# Patient Record
Sex: Female | Born: 1965 | Race: White | Hispanic: No | Marital: Married | Smoking: Former smoker
Health system: Southern US, Community
[De-identification: ages and names within clinical notes are randomized; demographics above are authoritative.]

## PROBLEM LIST (undated history)

## (undated) DIAGNOSIS — D509 Iron deficiency anemia, unspecified: Secondary | ICD-10-CM

## (undated) DIAGNOSIS — K5730 Diverticulosis of large intestine without perforation or abscess without bleeding: Secondary | ICD-10-CM

## (undated) DIAGNOSIS — N83209 Unspecified ovarian cyst, unspecified side: Secondary | ICD-10-CM

## (undated) DIAGNOSIS — K909 Intestinal malabsorption, unspecified: Principal | ICD-10-CM

## (undated) DIAGNOSIS — R519 Headache, unspecified: Secondary | ICD-10-CM

## (undated) DIAGNOSIS — R001 Bradycardia, unspecified: Secondary | ICD-10-CM

## (undated) DIAGNOSIS — K760 Fatty (change of) liver, not elsewhere classified: Secondary | ICD-10-CM

## (undated) DIAGNOSIS — R51 Headache: Secondary | ICD-10-CM

## (undated) DIAGNOSIS — IMO0001 Reserved for inherently not codable concepts without codable children: Secondary | ICD-10-CM

## (undated) DIAGNOSIS — K219 Gastro-esophageal reflux disease without esophagitis: Secondary | ICD-10-CM

## (undated) DIAGNOSIS — Z923 Personal history of irradiation: Secondary | ICD-10-CM

## (undated) DIAGNOSIS — R935 Abnormal findings on diagnostic imaging of other abdominal regions, including retroperitoneum: Secondary | ICD-10-CM

## (undated) DIAGNOSIS — Z8719 Personal history of other diseases of the digestive system: Secondary | ICD-10-CM

## (undated) DIAGNOSIS — J449 Chronic obstructive pulmonary disease, unspecified: Secondary | ICD-10-CM

## (undated) HISTORY — DX: Unspecified ovarian cyst, unspecified side: N83.209

## (undated) HISTORY — DX: Bradycardia, unspecified: R00.1

## (undated) HISTORY — DX: Personal history of irradiation: Z92.3

## (undated) HISTORY — DX: Abnormal findings on diagnostic imaging of other abdominal regions, including retroperitoneum: R93.5

## (undated) HISTORY — DX: Intestinal malabsorption, unspecified: K90.9

## (undated) HISTORY — PX: CHOLECYSTECTOMY: SHX55

## (undated) HISTORY — PX: ABDOMINAL HYSTERECTOMY: SHX81

## (undated) HISTORY — DX: Iron deficiency anemia, unspecified: D50.9

## (undated) HISTORY — DX: Diverticulosis of large intestine without perforation or abscess without bleeding: K57.30

## (undated) HISTORY — DX: Chronic obstructive pulmonary disease, unspecified: J44.9

---

## 2015-05-02 ENCOUNTER — Other Ambulatory Visit: Payer: Self-pay | Admitting: Sports Medicine

## 2015-05-02 DIAGNOSIS — M25551 Pain in right hip: Secondary | ICD-10-CM

## 2015-05-04 ENCOUNTER — Ambulatory Visit
Admission: RE | Admit: 2015-05-04 | Discharge: 2015-05-04 | Disposition: A | Discharge status: Home / Self Care | Payer: BLUE CROSS/BLUE SHIELD | Source: Ambulatory Visit | Attending: Sports Medicine | Admitting: Sports Medicine

## 2015-05-04 DIAGNOSIS — M25551 Pain in right hip: Secondary | ICD-10-CM

## 2015-05-14 ENCOUNTER — Other Ambulatory Visit: Payer: Self-pay | Admitting: Sports Medicine

## 2015-05-27 ENCOUNTER — Telehealth: Payer: Self-pay | Admitting: Hematology & Oncology

## 2015-05-27 NOTE — Telephone Encounter (Signed)
i called and gave patient new patient apt that was sch on 08/07/15.  Patient stated she needed to come sooner, so she cx apt and resch for 07/08/15

## 2015-06-17 ENCOUNTER — Other Ambulatory Visit: Payer: Self-pay | Admitting: Nurse Practitioner

## 2015-06-17 DIAGNOSIS — Z1231 Encounter for screening mammogram for malignant neoplasm of breast: Secondary | ICD-10-CM

## 2015-06-25 ENCOUNTER — Ambulatory Visit
Admission: RE | Admit: 2015-06-25 | Discharge: 2015-06-25 | Disposition: A | Discharge status: Home / Self Care | Payer: BLUE CROSS/BLUE SHIELD | Source: Ambulatory Visit | Attending: Nurse Practitioner | Admitting: Nurse Practitioner

## 2015-06-25 DIAGNOSIS — Z1231 Encounter for screening mammogram for malignant neoplasm of breast: Secondary | ICD-10-CM

## 2015-07-08 ENCOUNTER — Ambulatory Visit (HOSPITAL_BASED_OUTPATIENT_CLINIC_OR_DEPARTMENT_OTHER): Payer: BLUE CROSS/BLUE SHIELD

## 2015-07-08 ENCOUNTER — Encounter: Payer: Self-pay | Admitting: Hematology & Oncology

## 2015-07-08 ENCOUNTER — Ambulatory Visit (HOSPITAL_BASED_OUTPATIENT_CLINIC_OR_DEPARTMENT_OTHER): Payer: BLUE CROSS/BLUE SHIELD | Admitting: Hematology & Oncology

## 2015-07-08 ENCOUNTER — Other Ambulatory Visit (HOSPITAL_BASED_OUTPATIENT_CLINIC_OR_DEPARTMENT_OTHER): Payer: BLUE CROSS/BLUE SHIELD

## 2015-07-08 VITALS — BP 118/77 | HR 70 | Temp 97.7°F | Resp 16 | Ht 65.0 in | Wt 157.0 lb

## 2015-07-08 DIAGNOSIS — D751 Secondary polycythemia: Secondary | ICD-10-CM

## 2015-07-08 DIAGNOSIS — R935 Abnormal findings on diagnostic imaging of other abdominal regions, including retroperitoneum: Secondary | ICD-10-CM

## 2015-07-08 DIAGNOSIS — D509 Iron deficiency anemia, unspecified: Secondary | ICD-10-CM | POA: Diagnosis not present

## 2015-07-08 LAB — CBC WITH DIFFERENTIAL (CANCER CENTER ONLY)
BASO#: 0 10*3/uL (ref 0.0–0.2)
BASO%: 0.3 % (ref 0.0–2.0)
EOS ABS: 0.2 10*3/uL (ref 0.0–0.5)
EOS%: 2.6 % (ref 0.0–7.0)
HCT: 42.4 % (ref 34.8–46.6)
HGB: 14.3 g/dL (ref 11.6–15.9)
LYMPH#: 3 10*3/uL (ref 0.9–3.3)
LYMPH%: 33.7 % (ref 14.0–48.0)
MCH: 28.1 pg (ref 26.0–34.0)
MCHC: 33.7 g/dL (ref 32.0–36.0)
MCV: 83 fL (ref 81–101)
MONO#: 0.6 10*3/uL (ref 0.1–0.9)
MONO%: 7 % (ref 0.0–13.0)
NEUT#: 5.1 10*3/uL (ref 1.5–6.5)
NEUT%: 56.4 % (ref 39.6–80.0)
PLATELETS: 299 10*3/uL (ref 145–400)
RBC: 5.09 10*6/uL (ref 3.70–5.32)
RDW: 18.1 % — ABNORMAL HIGH (ref 11.1–15.7)
WBC: 9 10*3/uL (ref 3.9–10.0)

## 2015-07-08 LAB — CHCC SATELLITE - SMEAR

## 2015-07-08 LAB — LACTATE DEHYDROGENASE (CC13): LDH: 136 U/L (ref 125–245)

## 2015-07-08 NOTE — Progress Notes (Signed)
Referral MD  Reason for Referral: Iron deficiency anemia   Chief Complaint  Patient presents with  . OTHER    New Patient  : I don't feel well. Nobody Mercedes Beltran what is wrong.  HPI: Mercedes Beltran is a very charming 49 year old white female. She is actually a substance abuse counselor.. She has not felt well for about 6 months. She's been to many doctors. She's had multiple procedures.  She has been found to be anemic. She was seen at [facility]. She's had colonoscopies. She's had upper endoscopies. Everything, so far, has turned out okay.   She then had an MRI of the right hip. This is because the hip was bothering her. The radiologist somehow medic comment that there was some heterogeneity in the bone marrow which could reflect a marrow infiltrative process, anemia, advancing age.  I think it was because of this report, that she was referred to the [facility].  She is very frustrated that no one can figure out what is wrong with her.  She is not lost weight.  She has seen a urologist. She's had an issue with kidney stones. Per she saw an orthopedic surgeon for the right hip. She says they do not want to do anything for her.  She has had lab work done. The lab work back in November 2015 showed a white cell count of 7.7. Hemoglobin was 12.2. MCV is 81. Platelet count was 393.  In April of this sure, her CBC showed a white cell count of 6.5. Hemoglobin 10.8. Platelet count 458. MCV 72.  In July of this sure, a CBC showed a white count 8. Hemoglobin 15.7. MCV 87. Platelet count was 308. Thereafter she had iron studies done in July which showed an iron saturation of 27%. Back in December of last year, the iron saturation was 8%. Patient says have borderline diabetes.  She has normal electrolytes. She said that she has a fatty liver. 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  Again, she is frustrated in not feeling well and just wants to know why.  She has been attested in credibly  thoroughly for a rheumatologic issue. All of her studies have come back negative. She is tested negative for hepatitis. Her see the edge protein was normal.  Her appetite is okay. She is not a vegetarian. Patient use to smoke but does not smoke anymore. She really does not drink.  Beltran, her performance status is ECOG 1.    No past medical history on file.:  No past surgical history on file.:   Current outpatient prescriptions:  .  Cyanocobalamin (VITAMIN B-12) 5000 MCG SUBL, Place under the tongue., Disp: , Rfl:  .  esomeprazole (NEXIUM) 40 MG capsule, TK ONE C PO BID, Disp: , Rfl: 11 .  ferrous sulfate 325 (65 FE) MG tablet, Take 325 mg by mouth., Disp: , Rfl:  .  folic acid (FOLVITE) 1 MG tablet, Take 1 mg by mouth., Disp: , Rfl:  .  norethindrone (AYGESTIN) 5 MG tablet, , Disp: , Rfl: 0 .  Vitamin D, Ergocalciferol, (DRISDOL) 50000 UNITS CAPS capsule, TK 1 C PO ONCE A WEEK FOR 8 WEEKS, Disp: , Rfl: 0:  :  Not on File:  No family history on file.:  Social History   Social History  . Marital Status: Unknown    Spouse Name: N/A  . Number of Children: N/A  . Years of Education: N/A   Occupational History  . Not on file.  Social History Main Topics  . Smoking status: Former Research scientist (life sciences)  . Smokeless tobacco: Not on file     Comment: quit 01/2011  . Alcohol Use: Not on file  . Drug Use: Not on file  . Sexual Activity: Not on file   Other Topics Concern  . Not on file   Social History Narrative  . No narrative on file  :  Pertinent items are noted in HPI.  Exam: '@IPVITALS' @ Well developed and well-nourished white female. Vital signs are temperature of 97.7. Pulse 70. Blood pressure 118/77. Weight is 157 pounds. Head and neck exam shows no ocular or oral lesions. There are no palpable cervical or supraclavicular lymph nodes. There is no scleral icterus. Thyroid is nonpalpable. Lungs are clear. Cardiac exam regular rate and rhythm with no murmurs, rubs or bruits. Abdomen  is soft. She has good bowel sounds. There is no fluid wave. There is no palpable liver or spleen tip. Back exam shows no tenderness over the spine, ribs or hips. Extremities shows no clubbing, cyanosis or edema. Neurological exam shows no focal neurological deficit skin exam shows no obvious rashes.    Recent Labs  07/08/15 1328  WBC 9.0  HGB 14.3  HCT 42.4  PLT 299   No results for input(s): NA, K, CL, CO2, GLUCOSE, BUN, CREATININE, CALCIUM in the last 72 hours.  Blood smear review  Normochromic and normocytic population of red blood cells. I see no target cells. There are no teardrop cells. I see no nucleated red cells. There are no schistocytes or spherocytes. I see no real oh formation. White cells per normal morphology maturation. There is no hypersegmented polys. I see no immature myeloid or lymphoid forms. Platelets are adequate in number and size.   Pathology: None     Assessment and Plan:  Mercedes Beltran is a 49 year old white female. She does not feel well. She has iron deficiency anemia. She does take antacids. Patient is had a very thorough workup from multiple doctors.  I'm not sure we will find out what is wrong with her. I cannot imagine her having a malignancy.  However, we are obligated by the radiologist's report. As such, we will have to get a bone marrow biopsy on her. I think this is the way that we will prove that there is no hematologic issue.  I talked to her about this. She is agreeable.  I cannot imagine any tonic cancer causing her problems.  I suppose she might still be iron deficient that can because in some of her fatigue and weakness.  I spent about 45 minutes with her. I went over her labs. I spent a recommendations. She is in agreement.  We will plan to get her back once to have the bone marrow report back.

## 2015-07-09 LAB — FERRITIN CHCC: FERRITIN: 20 ng/mL (ref 9–269)

## 2015-07-09 LAB — IRON AND TIBC CHCC
%SAT: 24 % (ref 21–57)
Iron: 74 ug/dL (ref 41–142)
TIBC: 305 ug/dL (ref 236–444)
UIBC: 231 ug/dL (ref 120–384)

## 2015-07-10 ENCOUNTER — Other Ambulatory Visit: Payer: Self-pay | Admitting: Radiology

## 2015-07-10 LAB — ERYTHROPOIETIN: Erythropoietin: 10.4 m[IU]/mL (ref 2.6–18.5)

## 2015-07-11 ENCOUNTER — Ambulatory Visit (HOSPITAL_COMMUNITY)
Admission: RE | Admit: 2015-07-11 | Discharge: 2015-07-11 | Disposition: A | Discharge status: Home / Self Care | Payer: BLUE CROSS/BLUE SHIELD | Source: Ambulatory Visit | Attending: Hematology & Oncology | Admitting: Hematology & Oncology

## 2015-07-11 ENCOUNTER — Encounter (HOSPITAL_COMMUNITY): Payer: Self-pay

## 2015-07-11 DIAGNOSIS — R935 Abnormal findings on diagnostic imaging of other abdominal regions, including retroperitoneum: Secondary | ICD-10-CM | POA: Insufficient documentation

## 2015-07-11 DIAGNOSIS — Z885 Allergy status to narcotic agent status: Secondary | ICD-10-CM | POA: Insufficient documentation

## 2015-07-11 DIAGNOSIS — R937 Abnormal findings on diagnostic imaging of other parts of musculoskeletal system: Secondary | ICD-10-CM | POA: Insufficient documentation

## 2015-07-11 DIAGNOSIS — D509 Iron deficiency anemia, unspecified: Secondary | ICD-10-CM | POA: Insufficient documentation

## 2015-07-11 DIAGNOSIS — R948 Abnormal results of function studies of other organs and systems: Secondary | ICD-10-CM | POA: Diagnosis not present

## 2015-07-11 DIAGNOSIS — Z87891 Personal history of nicotine dependence: Secondary | ICD-10-CM | POA: Diagnosis not present

## 2015-07-11 DIAGNOSIS — D649 Anemia, unspecified: Secondary | ICD-10-CM | POA: Insufficient documentation

## 2015-07-11 DIAGNOSIS — Z79899 Other long term (current) drug therapy: Secondary | ICD-10-CM | POA: Diagnosis not present

## 2015-07-11 DIAGNOSIS — K219 Gastro-esophageal reflux disease without esophagitis: Secondary | ICD-10-CM | POA: Insufficient documentation

## 2015-07-11 HISTORY — DX: Fatty (change of) liver, not elsewhere classified: K76.0

## 2015-07-11 HISTORY — DX: Reserved for inherently not codable concepts without codable children: IMO0001

## 2015-07-11 HISTORY — DX: Headache: R51

## 2015-07-11 HISTORY — DX: Personal history of other diseases of the digestive system: Z87.19

## 2015-07-11 HISTORY — DX: Headache, unspecified: R51.9

## 2015-07-11 HISTORY — DX: Gastro-esophageal reflux disease without esophagitis: K21.9

## 2015-07-11 LAB — PROTIME-INR
INR: 1.04 (ref 0.00–1.49)
Prothrombin Time: 13.8 seconds (ref 11.6–15.2)

## 2015-07-11 LAB — CBC
HEMATOCRIT: 42.6 % (ref 36.0–46.0)
Hemoglobin: 14.4 g/dL (ref 12.0–15.0)
MCH: 28.7 pg (ref 26.0–34.0)
MCHC: 33.8 g/dL (ref 30.0–36.0)
MCV: 84.9 fL (ref 78.0–100.0)
PLATELETS: 297 10*3/uL (ref 150–400)
RBC: 5.02 MIL/uL (ref 3.87–5.11)
RDW: 17.6 % — AB (ref 11.5–15.5)
WBC: 7.1 10*3/uL (ref 4.0–10.5)

## 2015-07-11 LAB — BONE MARROW EXAM

## 2015-07-11 LAB — APTT: aPTT: 27 seconds (ref 24–37)

## 2015-07-11 MED ORDER — MIDAZOLAM HCL 2 MG/2ML IJ SOLN
INTRAMUSCULAR | Status: AC
  Filled 2015-07-11: qty 4

## 2015-07-11 MED ORDER — ONDANSETRON HCL 4 MG/2ML IJ SOLN
4.0000 mg | Freq: Once | INTRAMUSCULAR | Status: AC
  Administered 2015-07-11: 4 mg via INTRAVENOUS
  Filled 2015-07-11: qty 2

## 2015-07-11 MED ORDER — SODIUM CHLORIDE 0.9 % IV SOLN
INTRAVENOUS | Status: DC
  Administered 2015-07-11: 08:00:00 via INTRAVENOUS

## 2015-07-11 MED ORDER — FENTANYL CITRATE (PF) 100 MCG/2ML IJ SOLN
INTRAMUSCULAR | Status: AC | PRN
  Administered 2015-07-11: 50 ug via INTRAVENOUS

## 2015-07-11 MED ORDER — HYDROCODONE-ACETAMINOPHEN 5-325 MG PO TABS
1.0000 | ORAL_TABLET | ORAL | Status: DC | PRN
  Filled 2015-07-11: qty 2

## 2015-07-11 MED ORDER — ONDANSETRON HCL 4 MG/2ML IJ SOLN
INTRAMUSCULAR | Status: AC
  Filled 2015-07-11: qty 2

## 2015-07-11 MED ORDER — FENTANYL CITRATE (PF) 100 MCG/2ML IJ SOLN
INTRAMUSCULAR | Status: AC
  Filled 2015-07-11: qty 2

## 2015-07-11 MED ORDER — MIDAZOLAM HCL 2 MG/2ML IJ SOLN
INTRAMUSCULAR | Status: AC | PRN
Start: 2015-07-11 — End: 2015-07-11
  Administered 2015-07-11: 1 mg via INTRAVENOUS
  Administered 2015-07-11: 0.5 mg via INTRAVENOUS

## 2015-07-11 NOTE — Discharge Instructions (Signed)
Bone Marrow Aspiration and Bone Marrow Biopsy, Care After May remove dressing in 24 hours. Tylenol or like medication for pain if needed. If no relief call [facility] at [phone] Refer to this sheet in the next few weeks. These instructions provide you with information about caring for yourself after your procedure. Your health care provider may also give you more specific instructions. Your treatment has been planned according to current medical practices, but problems sometimes occur. Call your health care provider if you have any problems or questions after your procedure. WHAT TO EXPECT AFTER THE PROCEDURE After your procedure, it is common to have:  Soreness or tenderness around the puncture site.  Bruising. HOME CARE INSTRUCTIONS  Take medicines only as directed by your health care provider.  Follow your health care provider's instructions about:  Puncture site care.  Bandage (dressing) changes and removal.  Bathe and shower as directed by your health care provider.  Check your puncture site every day for signs of infection. Watch for:  Redness, swelling, or pain.  Fluid, blood, or pus.  Return to your normal activities as directed by your health care provider.  Keep all follow-up visits as directed by your health care provider. This is important. SEEK MEDICAL CARE IF:  You have a fever.  You have uncontrollable bleeding.  You have redness, swelling, or pain at the site of your puncture.  You have fluid, blood, or pus coming from your puncture site.   This information is not intended to replace advice given to you by your health care provider. Make sure you discuss any questions you have with your health care provider.   Document Released: 03/06/2005 Document Revised: 01/01/2015 Document Reviewed: 08/08/2014 Elsevier Interactive Patient Education 2016 [place]. Bone Marrow Aspiration and Bone Marrow Biopsy Bone marrow aspiration and bone marrow  biopsy are procedures that are done to diagnose blood disorders. You may also have one of these procedures to help diagnose infections or some types of cancer. Bone marrow is the soft tissue that is inside your bones. Blood cells are produced in bone marrow. For bone marrow aspiration, a sample of tissue in liquid form is removed from inside your bone. For a bone marrow biopsy, a small core of bone marrow tissue is removed. Then these samples are examined under a microscope or tested in a lab. You may need these procedures if you have an abnormal complete blood count (CBC). The aspiration or biopsy sample is usually taken from the top of your hip bone. Sometimes, an aspiration sample is taken from your chest bone (sternum). LET [facility] CARE PROVIDER KNOW ABOUT:  Any allergies you have.  All medicines you are taking, including vitamins, herbs, eye drops, creams, and over-the-counter medicines.  Previous problems you or members of your family have had with the use of anesthetics.  Any blood disorders you have.  Previous surgeries you have had.  Any medical conditions you may have.  Whether you are pregnant or you think that you may be pregnant. RISKS AND COMPLICATIONS Generally, this is a safe procedure. However, problems may occur, including:  Infection.  Bleeding. BEFORE THE PROCEDURE  Ask your health care provider about:  Changing or stopping your regular medicines. This is especially important if you are taking diabetes medicines or blood thinners.  Taking medicines such as aspirin and ibuprofen. These medicines can thin your blood. Do not take these medicines before your procedure if your health care provider instructs you not to.  Plan  to have someone take you home after the procedure.  If you go home right after the procedure, plan to have someone with you for 24 hours. PROCEDURE   An IV tube may be inserted into one of your veins.  The injection site will be cleaned  with a germ-killing solution (antiseptic).  You will be given one or more of the following:  A medicine that helps you relax (sedative).  A medicine that numbs the area (local anesthetic).  The bone marrow sample will be removed as follows:  For an aspiration, a hollow needle will be inserted through your skin and into your bone. Bone marrow fluid will be drawn up into a syringe.  For a biopsy, your health care provider will use a hollow needle to remove a core of tissue from your bone marrow.  The needle will be removed.  A bandage (dressing) will be placed over the insertion site and taped in place. The procedure may vary among health care providers and hospitals. AFTER THE PROCEDURE  Your blood pressure, heart rate, breathing rate, and blood oxygen level will be monitored often until the medicines you were given have worn off.  Return to your normal activities as directed by your health care provider.   This information is not intended to replace advice given to you by your health care provider. Make sure you discuss any questions you have with your health care provider.   Document Released: 08/20/2004 Document Revised: 01/01/2015 Document Reviewed: 08/08/2014 Elsevier Interactive Patient Education 2016 Elsevier Inc. Moderate Conscious Sedation, Adult Sedation is the use of medicines to promote relaxation and relieve discomfort and anxiety. Moderate conscious sedation is a type of sedation. Under moderate conscious sedation you are less alert than normal but are still able to respond to instructions or stimulation. Moderate conscious sedation is used during short medical and dental procedures. It is milder than deep sedation or general anesthesia and allows you to return to your regular activities sooner. LET [facility] CARE PROVIDER KNOW ABOUT:   Any allergies you have.  All medicines you are taking, including vitamins, herbs, eye drops, creams, and over-the-counter  medicines.  Use of steroids (by mouth or creams).  Previous problems you or members of your family have had with the use of anesthetics.  Any blood disorders you have.  Previous surgeries you have had.  Medical conditions you have.  Possibility of pregnancy, if this applies.  Use of cigarettes, alcohol, or illegal drugs. RISKS AND COMPLICATIONS Generally, this is a safe procedure. However, as with any procedure, problems can occur. Possible problems include:  Oversedation.  Trouble breathing on your own. You may need to have a breathing tube until you are awake and breathing on your own.  Allergic reaction to any of the medicines used for the procedure. BEFORE THE PROCEDURE  You may have blood tests done. These tests can help show how well your kidneys and liver are working. They can also show how well your blood clots.  A physical exam will be done.  Only take medicines as directed by your health care provider. You may need to stop taking medicines (such as blood thinners, aspirin, or nonsteroidal anti-inflammatory drugs) before the procedure.   Do not eat or drink at least 6 hours before the procedure or as directed by your health care provider.  Arrange for a responsible adult, family member, or friend to take you home after the procedure. He or she should stay with you for at least 24  hours after the procedure, until the medicine has worn off. PROCEDURE   An intravenous (IV) catheter will be inserted into one of your veins. Medicine will be able to flow directly into your body through this catheter. You may be given medicine through this tube to help prevent pain and help you relax.  The medical or dental procedure will be done. AFTER THE PROCEDURE  You will stay in a recovery area until the medicine has worn off. Your blood pressure and pulse will be checked.   Depending on the procedure you had, you may be allowed to go home when you can tolerate liquids and your  pain is under control.   This information is not intended to replace advice given to you by your health care provider. Make sure you discuss any questions you have with your health care provider.   Document Released: 05/12/2001 Document Revised: 09/07/2014 Document Reviewed: 04/24/2013 Elsevier Interactive Patient Education [employer].

## 2015-07-11 NOTE — H&P (Signed)
Chief Complaint: Patient was seen in consultation today for bone marrow biopsy at the request of Ennever,Peter R  Referring Physician(s): Ennever,Peter R  History of Present Illness: Mercedes Beltran is a 49 y.o. female with chronic anemia. She also recently had an MR of her hip which suggested some heterogeneity of her marrow. She is referred for bone marrow biopsy. PMHx, meds, labs, imaging reviewed. Has been NPO this am  Past Medical History  Diagnosis Date  . Shortness of breath dyspnea     since 08/2014  . GERD (gastroesophageal reflux disease)   . History of hiatal hernia   . Headache   . Fatty liver     Past Surgical History  Procedure Laterality Date  . Cholecystectomy    . Abdominal hysterectomy      Allergies: Codeine; Demerol; and Morphine and related  Medications: Prior to Admission medications   Medication Sig Start Date End Date Taking? Authorizing Provider  esomeprazole (NEXIUM) 40 MG capsule TK ONE C PO BID 06/19/15  Yes Historical Provider, MD  ferrous sulfate 325 (65 FE) MG tablet Take 325 mg by mouth. 12/12/14 12/12/15 Yes Historical Provider, MD  folic acid (FOLVITE) 1 MG tablet Take 1 mg by mouth.   Yes Historical Provider, MD  ibuprofen (ADVIL,MOTRIN) 200 MG tablet Take 200 mg by mouth every 6 (six) hours as needed.   Yes Historical Provider, MD  norethindrone (AYGESTIN) 5 MG tablet  06/12/15  Yes Historical Provider, MD  Cyanocobalamin (VITAMIN B-12) 5000 MCG SUBL Place under the tongue.    Historical Provider, MD  Vitamin D, Ergocalciferol, (DRISDOL) 50000 UNITS CAPS capsule TK 1 C PO ONCE A WEEK FOR 8 WEEKS 06/14/15   Historical Provider, MD     History reviewed. No pertinent family history.  Social History   Social History  . Marital Status: Married    Spouse Name: N/A  . Number of Children: N/A  . Years of Education: N/A   Social History Main Topics  . Smoking status: Former Smoker    Quit date: 02/20/2011  . Smokeless tobacco: None    Comment: quit 01/2011  . Alcohol Use: No  . Drug Use: None  . Sexual Activity: Yes   Other Topics Concern  . None   Social History Narrative    Review of Systems: A 12 point ROS discussed and pertinent positives are indicated in the HPI above.  All other systems are negative.  Review of Systems  Vital Signs: BP 114/63 mmHg  Pulse 57  Temp(Src) 98.5 F (36.9 C) (Oral)  Resp 16  Ht '5\' 5"'  (1.651 m)  Wt 157 lb (71.215 kg)  BMI 26.13 kg/m2  SpO2 99%  Physical Exam  Constitutional: She is oriented to person, place, and time. She appears well-developed and well-nourished. No distress.  HENT:  Head: Normocephalic.  Mouth/Throat: Oropharynx is clear and moist.  Neck: Normal range of motion. No JVD present. No tracheal deviation present.  Cardiovascular: Normal rate, regular rhythm and normal heart sounds.   Pulmonary/Chest: Effort normal and breath sounds normal. No respiratory distress.  Abdominal: Soft. She exhibits no mass. There is no tenderness.  Neurological: She is alert and oriented to person, place, and time.  Psychiatric: She has a normal mood and affect. Judgment normal.    Mallampati Score:  MD Evaluation Airway: WNL Heart: WNL Abdomen: WNL Chest/ Lungs: WNL ASA  Classification: 2 Mallampati/Airway Score: One  Imaging: Mm Digital Screening Bilateral  07/09/2015  CLINICAL DATA:  Screening. EXAM: DIGITAL  SCREENING BILATERAL MAMMOGRAM WITH CAD COMPARISON:  Previous exam(s). ACR Breast Density Category c: The breast tissue is heterogeneously dense, which may obscure small masses. FINDINGS: There are no findings suspicious for malignancy. Images were processed with CAD. IMPRESSION: No mammographic evidence of malignancy. A result letter of this screening mammogram will be mailed directly to the patient. RECOMMENDATION: Screening mammogram in one year. (Code:SM-B-01Y) BI-RADS CATEGORY  1: Negative. Electronically Signed   By: Ammie Ferrier M.D.   On: 07/09/2015  14:58    Labs:  CBC:  Recent Labs  07/08/15 1328 07/11/15 0730  WBC 9.0 7.1  HGB 14.3 14.4  HCT 42.4 42.6  PLT 299 297    COAGS:  Recent Labs  07/11/15 0730  INR 1.04  APTT 27    Assessment and Plan: Anemia Abnormal appearance of bone marrow on recent MRI Plan for bone marrow biopsy today Labs reviewed. Risks and Benefits discussed with the patient including, but not limited to bleeding, infection, damage to adjacent structures or low yield requiring additional tests. All of the patient's questions were answered, patient is agreeable to proceed. Consent signed and in chart.    Thank you for this interesting consult.  I greatly enjoyed meeting Mercedes Beltran and look forward to participating in their care.  A copy of this report was sent to the requesting provider on this date.  SignedAscencion Dike 07/11/2015, 8:40 AM   I spent a total of 20 minutesin face to face in clinical consultation, greater than 50% of which was counseling/coordinating care for bone marrow biopsy

## 2015-07-11 NOTE — Procedures (Signed)
CT-guided  R iliac bone marrow aspiration and core biopsy No complication No blood loss. See complete dictation in [facility] PACS  

## 2015-07-17 ENCOUNTER — Other Ambulatory Visit: Payer: Self-pay | Admitting: Hematology & Oncology

## 2015-07-17 ENCOUNTER — Encounter: Payer: Self-pay | Admitting: Hematology & Oncology

## 2015-07-17 DIAGNOSIS — K909 Intestinal malabsorption, unspecified: Secondary | ICD-10-CM | POA: Insufficient documentation

## 2015-07-17 DIAGNOSIS — R5382 Chronic fatigue, unspecified: Secondary | ICD-10-CM

## 2015-07-17 HISTORY — DX: Intestinal malabsorption, unspecified: K90.9

## 2015-07-17 LAB — CHROMOSOME ANALYSIS, BONE MARROW

## 2015-07-18 ENCOUNTER — Ambulatory Visit (HOSPITAL_BASED_OUTPATIENT_CLINIC_OR_DEPARTMENT_OTHER): Payer: BLUE CROSS/BLUE SHIELD

## 2015-07-18 VITALS — BP 109/69 | HR 73 | Temp 98.4°F | Resp 20

## 2015-07-18 DIAGNOSIS — D509 Iron deficiency anemia, unspecified: Secondary | ICD-10-CM

## 2015-07-18 DIAGNOSIS — K909 Intestinal malabsorption, unspecified: Secondary | ICD-10-CM

## 2015-07-18 MED ORDER — SODIUM CHLORIDE 0.9 % IV SOLN
Freq: Once | INTRAVENOUS | Status: AC
  Administered 2015-07-18: 14:00:00 via INTRAVENOUS

## 2015-07-18 MED ORDER — FERUMOXYTOL INJECTION 510 MG/17 ML
510.0000 mg | Freq: Once | INTRAVENOUS | Status: AC
  Administered 2015-07-18: 510 mg via INTRAVENOUS
  Filled 2015-07-18: qty 17

## 2015-07-18 NOTE — Patient Instructions (Addendum)
Ferumoxytol injection What is this medicine? FERUMOXYTOL is an iron complex. Iron is used to make healthy red blood cells, which carry oxygen and nutrients throughout the body. This medicine is used to treat iron deficiency anemia in people with chronic kidney disease. This medicine may be used for other purposes; ask your health care provider or pharmacist if you have questions. What should I tell my health care provider before I take this medicine? They need to know if you have any of these conditions: -anemia not caused by low iron levels -high levels of iron in the blood -magnetic resonance imaging (MRI) test scheduled -an unusual or allergic reaction to iron, other medicines, foods, dyes, or preservatives -pregnant or trying to get pregnant -breast-feeding How should I use this medicine? This medicine is for injection into a vein. It is given by a health care professional in a hospital or clinic setting. Talk to your pediatrician regarding the use of this medicine in children. Special care may be needed. Overdosage: If you think you have taken too much of this medicine contact a poison control center or emergency room at once. NOTE: This medicine is only for you. Do not share this medicine with others. What if I miss a dose? It is important not to miss your dose. Call your doctor or health care professional if you are unable to keep an appointment. What may interact with this medicine? This medicine may interact with the following medications: -other iron products This list may not describe all possible interactions. Give your health care provider a list of all the medicines, herbs, non-prescription drugs, or dietary supplements you use. Also tell them if you smoke, drink alcohol, or use illegal drugs. Some items may interact with your medicine. What should I watch for while using this medicine? Visit your doctor or healthcare professional regularly. Tell your doctor or healthcare  professional if your symptoms do not start to get better or if they get worse. You may need blood work done while you are taking this medicine. You may need to follow a special diet. Talk to your doctor. Foods that contain iron include: whole grains/cereals, dried fruits, beans, or peas, leafy green vegetables, and organ meats (liver, kidney). What side effects may I notice from receiving this medicine? Side effects that you should report to your doctor or health care professional as soon as possible: -allergic reactions like skin rash, itching or hives, swelling of the face, lips, or tongue -breathing problems -changes in blood pressure -feeling faint or lightheaded, falls -fever or chills -flushing, sweating, or hot feelings -swelling of the ankles or feet Side effects that usually do not require medical attention (Report these to your doctor or health care professional if they continue or are bothersome.): -diarrhea -headache -nausea, vomiting -stomach pain This list may not describe all possible side effects. Call your doctor for medical advice about side effects. You may report side effects to FDA at 1-800-FDA-1088. Where should I keep my medicine? This drug is given in a hospital or clinic and will not be stored at home. NOTE: This sheet is a summary. It may not cover all possible information. If you have questions about this medicine, talk to your doctor, pharmacist, or health care provider.    2016, Elsevier/Gold Standard. (2012-04-01 15:23:36) Iron Level and Total Iron-Binding Capacity Tests Iron comes from your diet and binds to a part of the red blood cell called hemoglobin. Hemoglobin carries iron throughout your body. In order for iron to bind to  hemoglobin, it must first be carried from your small intestine into your bone marrow by proteins called transferrin. Iron level and total iron-binding capacity (TIBC) are tests that determine how much iron you have in your blood (serum  iron level), and the amount of transferrin that is available to transport iron to the bone marrow (iron-binding capacity). These tests are often done together. They may be done to:  Diagnose iron-deficiency anemia.  Monitor treatment for iron-deficiency anemia.  Monitor the progression of conditions that cause your body to break down red blood cells more quickly than normal. You may have these tests if:   You are pregnant.  You have had a blood test that showed abnormal red blood cell numbers, size, or color.  Your health care provider suspects iron overload, iron poisoning, or low levels of iron due to blood loss. PREPARATION FOR TEST Do not eat or drink anything after midnight on the night before the procedure or as directed by your health care provider. RESULTS It is your responsibility to obtain your test results. Ask the lab or department performing the test when and how you will get your results. Talk with your health care provider if you have any questions about your results. Range of Normal Values Ranges for normal values may vary among different labs and hospitals. You should always check with your health care provider after having lab work or other tests done to discuss whether your values are considered within normal limits. The normal findings for each test are listed here: Iron  Female: 80-180 microgram/dL or 14-32 micromole/L (SI units).  Female: 60-160 microgram/dL or 11-29 micromole/L (SI units).  Newborn: 100-250 microgram/dL.  Child: 50-120 microgram/dL. TIBC  250-460 microgram/dL or 45-82 micromole/L (SI units). Transferrin  Adult female: 215-365 mg/dL or 2.15-3.65 g/L (SI units).  Adult female: 250-380 mg/dL or 2.5-3.8 g/L (SI units).  Newborn: 130-275 mg/dL.  Child: 203-360 mg/dL. Transferrin saturation  Female: 20-50%.  Female: 15-50%. Many factors can affect iron level and TIBC test results. These may include:  Recent blood transfusions.  Recently  eating a meal that contains high levels of iron.  Certain medicines. Meaning of Results Outside Normal Value Ranges Serum iron levels that are higher than normal may be related to many health conditions, including:  Genetic disorders that cause an increase in iron levels, such as hemosiderosis and hemochromatosis.  Iron poisoning.  Hemolytic anemia.  Liver diseases such as hepatitis and hepatic necrosis.  Lead poisoning. Serum iron levels that are lower than normal may be related to many health conditions, including:  Poor dietary iron intake.  Long-term blood loss.  Insufficient absorption of iron in the small intestine.  Pregnancy.  Iron-deficiency anemia.  Tumors. Abnormally high TIBC may be related to many health conditions, including:  Estrogen therapy.  Pregnancy.  Polycythemia vera.  Iron-deficiency anemia. Abnormally low TIBC may be related to many health conditions, including:  Malnutrition.  Hypoproteinemia.  Conditions that cause swelling (inflammation) throughout the body.  Cirrhosis of the liver.  Some anemias, including hemolytic, pernicious, and sickle cell anemias. Discuss your test results with your health care provider. He or she will use the results to make a diagnosis and determine a treatment plan that is right for you.   This information is not intended to replace advice given to you by your health care provider. Make sure you discuss any questions you have with your health care provider.   Document Released: 09/10/2004 Document Revised: 09/07/2014 Document Reviewed: 01/03/2014 Elsevier Interactive Patient  Education 2016 [employer].

## 2015-08-07 ENCOUNTER — Other Ambulatory Visit: Payer: BLUE CROSS/BLUE SHIELD

## 2015-08-07 ENCOUNTER — Ambulatory Visit: Payer: BLUE CROSS/BLUE SHIELD | Admitting: Hematology & Oncology

## 2015-08-07 ENCOUNTER — Ambulatory Visit: Payer: BLUE CROSS/BLUE SHIELD

## 2015-08-23 ENCOUNTER — Encounter: Payer: Self-pay | Admitting: Family

## 2015-08-23 ENCOUNTER — Ambulatory Visit (HOSPITAL_BASED_OUTPATIENT_CLINIC_OR_DEPARTMENT_OTHER): Payer: BLUE CROSS/BLUE SHIELD | Admitting: Family

## 2015-08-23 ENCOUNTER — Other Ambulatory Visit (HOSPITAL_BASED_OUTPATIENT_CLINIC_OR_DEPARTMENT_OTHER): Payer: BLUE CROSS/BLUE SHIELD

## 2015-08-23 VITALS — BP 106/74 | HR 70 | Temp 97.4°F | Resp 16 | Ht 65.0 in | Wt 156.0 lb

## 2015-08-23 DIAGNOSIS — D509 Iron deficiency anemia, unspecified: Secondary | ICD-10-CM

## 2015-08-23 DIAGNOSIS — R5382 Chronic fatigue, unspecified: Secondary | ICD-10-CM

## 2015-08-23 DIAGNOSIS — R5383 Other fatigue: Secondary | ICD-10-CM

## 2015-08-23 DIAGNOSIS — K909 Intestinal malabsorption, unspecified: Secondary | ICD-10-CM

## 2015-08-23 LAB — CMP (CANCER CENTER ONLY)
ALT(SGPT): 30 U/L (ref 10–47)
AST: 25 U/L (ref 11–38)
Albumin: 3.7 g/dL (ref 3.3–5.5)
Alkaline Phosphatase: 61 U/L (ref 26–84)
BUN, Bld: 11 mg/dL (ref 7–22)
CALCIUM: 9.4 mg/dL (ref 8.0–10.3)
CO2: 25 meq/L (ref 18–33)
CREATININE: 0.8 mg/dL (ref 0.6–1.2)
Chloride: 105 mEq/L (ref 98–108)
GLUCOSE: 86 mg/dL (ref 73–118)
Potassium: 4.3 mEq/L (ref 3.3–4.7)
SODIUM: 143 meq/L (ref 128–145)
Total Bilirubin: 1 mg/dl (ref 0.20–1.60)
Total Protein: 7.6 g/dL (ref 6.4–8.1)

## 2015-08-23 LAB — CBC WITH DIFFERENTIAL (CANCER CENTER ONLY)
BASO#: 0 10*3/uL (ref 0.0–0.2)
BASO%: 0.5 % (ref 0.0–2.0)
EOS%: 1.2 % (ref 0.0–7.0)
Eosinophils Absolute: 0.1 10*3/uL (ref 0.0–0.5)
HCT: 47.1 % — ABNORMAL HIGH (ref 34.8–46.6)
HGB: 16 g/dL — ABNORMAL HIGH (ref 11.6–15.9)
LYMPH#: 2.6 10*3/uL (ref 0.9–3.3)
LYMPH%: 35.4 % (ref 14.0–48.0)
MCH: 29.4 pg (ref 26.0–34.0)
MCHC: 34 g/dL (ref 32.0–36.0)
MCV: 86 fL (ref 81–101)
MONO#: 0.5 10*3/uL (ref 0.1–0.9)
MONO%: 7.1 % (ref 0.0–13.0)
NEUT#: 4.1 10*3/uL (ref 1.5–6.5)
NEUT%: 55.8 % (ref 39.6–80.0)
PLATELETS: 306 10*3/uL (ref 145–400)
RBC: 5.45 10*6/uL — ABNORMAL HIGH (ref 3.70–5.32)
RDW: 15.5 % (ref 11.1–15.7)
WBC: 7.4 10*3/uL (ref 3.9–10.0)

## 2015-08-23 LAB — FERRITIN: Ferritin: 143 ng/ml (ref 9–269)

## 2015-08-23 LAB — TSH: TSH: 1.673 m(IU)/L (ref 0.308–3.960)

## 2015-08-23 LAB — IRON AND TIBC
%SAT: 59 % — AB (ref 21–57)
IRON: 171 ug/dL — AB (ref 41–142)
TIBC: 290 ug/dL (ref 236–444)
UIBC: 119 ug/dL — ABNORMAL LOW (ref 120–384)

## 2015-08-23 LAB — CHCC SATELLITE - SMEAR

## 2015-08-23 NOTE — Progress Notes (Signed)
Hematology and Oncology Follow Up Visit  Mercedes Beltran [id number] [birth year] 49 y.o. 08/23/2015   Principle Diagnosis:  Iron deficiency anemia   Current Therapy:   IV iron as indicated    Interim History:  Mercedes Beltran is here today for a follow-up. She is still having fatigue, chills and night sweats. She received Feraheme in November. We did recheck her iron studies today and results are pending.  She has some SOB at times with exertion. No fever, n/v, cough, rash, chest pain or changes in bowel or bladder habits.  No episodes of bleeding or bruising. No lymphadenopathy found on exam.  She has had occasional palpitations, dizziness and abdominal pain that comes and goes.  Her right hip still bother her at times. Her bone marrow biopsy was negative. No swelling, tenderness, numbness or tingling in her extremities at this time.  She is eating healthy and consuming iron rich foods daily. She is staying well hydrated. Her weight is unchanged.    Medications:    Medication List       This list is accurate as of: 08/23/15  8:49 AM.  Always use your most recent med list.               esomeprazole 40 MG capsule  Commonly known as:  NEXIUM  TK ONE C PO BID     folic acid 1 MG tablet  Commonly known as:  FOLVITE  Take 1 mg by mouth.     ibuprofen 200 MG tablet  Commonly known as:  ADVIL,MOTRIN  Take 200 mg by mouth every 6 (six) hours as needed.     norethindrone 5 MG tablet  Commonly known as:  AYGESTIN     Vitamin B-12 5000 MCG Subl  Place under the tongue.     Vitamin D (Ergocalciferol) 50000 UNITS Caps capsule  Commonly known as:  DRISDOL  TK 1 C PO ONCE A WEEK FOR 8 WEEKS        Allergies:  Allergies  Allergen Reactions  . Codeine Nausea And Vomiting  . Demerol [Meperidine] Nausea And Vomiting  . Morphine And Related Nausea And Vomiting    Past Medical History, Surgical history, Social history, and Family History were reviewed and updated.  Review of  Systems: All other 10 point review of systems is negative.   Physical Exam:  height is 5' 5" (1.651 m) and weight is 156 lb (70.761 kg). Her oral temperature is 97.4 F (36.3 C). Her blood pressure is 106/74 and her pulse is 70. Her respiration is 16.   Wt Readings from Last 3 Encounters:  08/23/15 156 lb (70.761 kg)  07/11/15 157 lb (71.215 kg)  07/08/15 157 lb (71.215 kg)    Ocular: Sclerae unicteric, pupils equal, round and reactive to light Ear-nose-throat: Oropharynx clear, dentition fair Lymphatic: No cervical supraclavicular or axillary adenopathy Lungs no rales or rhonchi, good excursion bilaterally Heart regular rate and rhythm, no murmur appreciated Abd soft, nontender, positive bowel sounds, no liver or spleen tip palpated on exam MSK no focal spinal tenderness, no joint edema Neuro: non-focal, well-oriented, appropriate affect Breasts: Deferred  Lab Results  Component Value Date   WBC 7.4 08/23/2015   HGB 16.0* 08/23/2015   HCT 47.1* 08/23/2015   MCV 86 08/23/2015   PLT 306 08/23/2015   Lab Results  Component Value Date   FERRITIN 20 07/08/2015   IRON 74 07/08/2015   TIBC 305 07/08/2015   UIBC 231 07/08/2015   IRONPCTSAT 24 07/08/2015  Lab Results  Component Value Date   RBC 5.45* 08/23/2015   No results found for: KPAFRELGTCHN, LAMBDASER, KAPLAMBRATIO No results found for: IGGSERUM, IGA, IGMSERUM No results found for: TOTALPROTELP, ALBUMINELP, A1GS, A2GS, BETS, BETA2SER, GAMS, MSPIKE, SPEI   Chemistry   No results found for: NA, K, CL, CO2, BUN, CREATININE, GLU No results found for: CALCIUM, ALKPHOS, AST, ALT, BILITOT   Impression and Plan: Mercedes Beltran is a 49 yo white female with iron deficiency anemia and NASH. She received feraheme in November so we will see what her iron studies today show. Her CBC and CMP today look good.  She is still c/o fatigue, chills, dizziness, SOB with exertion, night sweats and occasional palpitations. All prior work-ups have  been negative.  Her bone marrow biopsy in November was also negative. No abnormality or evidence of malignancy.  We will give her a dose of Feraheme next week if needed.  At this point we will follow-up with her as needed.  She plans to follow-up with her PCP to have her thyroid checked. It may also be beneficial for her to follow-up with her gynecologist and have her labs checked to see if she is starting to go through menopause.  She will contact us with any heme/onc related issues. We will be happy to see her again when needed.   Eliezer Bottom, NP 12/23/20168:49 AM

## 2015-09-06 ENCOUNTER — Other Ambulatory Visit (HOSPITAL_COMMUNITY)
Admission: RE | Admit: 2015-09-06 | Discharge: 2015-09-06 | Disposition: A | Discharge status: Home / Self Care | Payer: BLUE CROSS/BLUE SHIELD | Source: Ambulatory Visit | Attending: Obstetrics & Gynecology | Admitting: Obstetrics & Gynecology

## 2015-09-06 ENCOUNTER — Other Ambulatory Visit: Payer: Self-pay | Admitting: Obstetrics & Gynecology

## 2015-09-06 DIAGNOSIS — Z01419 Encounter for gynecological examination (general) (routine) without abnormal findings: Secondary | ICD-10-CM | POA: Diagnosis present

## 2015-09-06 DIAGNOSIS — Z1151 Encounter for screening for human papillomavirus (HPV): Secondary | ICD-10-CM | POA: Insufficient documentation

## 2015-09-10 LAB — CYTOLOGY - PAP

## 2015-12-20 ENCOUNTER — Encounter: Payer: Self-pay | Admitting: Hematology & Oncology

## 2017-04-02 ENCOUNTER — Other Ambulatory Visit (HOSPITAL_COMMUNITY): Payer: Self-pay | Admitting: Family

## 2017-04-02 DIAGNOSIS — R945 Abnormal results of liver function studies: Secondary | ICD-10-CM

## 2017-04-02 DIAGNOSIS — R7989 Other specified abnormal findings of blood chemistry: Secondary | ICD-10-CM

## 2017-04-09 ENCOUNTER — Encounter (INDEPENDENT_AMBULATORY_CARE_PROVIDER_SITE_OTHER): Payer: Self-pay | Admitting: Orthopedic Surgery

## 2017-04-09 ENCOUNTER — Encounter (HOSPITAL_COMMUNITY): Payer: Self-pay

## 2017-04-09 ENCOUNTER — Ambulatory Visit (HOSPITAL_COMMUNITY)
Admission: RE | Admit: 2017-04-09 | Discharge: 2017-04-09 | Disposition: A | Discharge status: Home / Self Care | Payer: BC Managed Care – PPO | Source: Ambulatory Visit | Attending: Family | Admitting: Family

## 2017-04-09 ENCOUNTER — Ambulatory Visit (INDEPENDENT_AMBULATORY_CARE_PROVIDER_SITE_OTHER): Payer: BC Managed Care – PPO | Admitting: Orthopedic Surgery

## 2017-04-09 VITALS — Ht 65.0 in | Wt 156.0 lb

## 2017-04-09 DIAGNOSIS — Z9049 Acquired absence of other specified parts of digestive tract: Secondary | ICD-10-CM | POA: Insufficient documentation

## 2017-04-09 DIAGNOSIS — R945 Abnormal results of liver function studies: Secondary | ICD-10-CM | POA: Insufficient documentation

## 2017-04-09 DIAGNOSIS — M545 Low back pain, unspecified: Secondary | ICD-10-CM | POA: Insufficient documentation

## 2017-04-09 DIAGNOSIS — M79604 Pain in right leg: Secondary | ICD-10-CM

## 2017-04-09 DIAGNOSIS — R7989 Other specified abnormal findings of blood chemistry: Secondary | ICD-10-CM

## 2017-04-09 MED ORDER — IOPAMIDOL (ISOVUE-300) INJECTION 61%
100.0000 mL | Freq: Once | INTRAVENOUS | Status: AC | PRN
  Administered 2017-04-09: 100 mL via INTRAVENOUS

## 2017-04-09 MED ORDER — IOPAMIDOL (ISOVUE-300) INJECTION 61%
INTRAVENOUS | Status: AC
  Filled 2017-04-09: qty 100

## 2017-04-09 MED ORDER — CELECOXIB 200 MG PO CAPS: 200.0000 mg | ORAL_CAPSULE | Freq: Two times a day (BID) | ORAL | 3 refills | Status: DC

## 2017-04-09 NOTE — Progress Notes (Signed)
Office Visit Note   Patient: Mercedes Beltran           Date of Birth: Feb 11, 1966           MRN: [id number] Visit Date: 04/09/2017              Requested by: No referring provider defined for this encounter. PCP: Imagene Riches, NP  Chief Complaint  Patient presents with  . Right Hip - Pain    Pt c/o of lateral hip pain, no groin pain. Does have LBP with rad s/s to posterior thigh       HPI: Patient is a 51 year old woman who is been having iliac crest hip pain as well as a feeling of radicular heat sensation in the posterior right thigh. Patient also complains of lower back pain midline. Patient denies any numbness tingling or weakness. Patient has had an MRI scan of her right hip approximately 2 years ago.  Assessment & Plan: Visit Diagnoses:  1. Low back pain radiating to right leg     Plan: We will start her on Celebrex 200 mg by mouth twice a day to see if this helps with radicular symptoms. Discussed that if this does not help with the symptoms we would obtain two-view radiographs of the lumbar spine and anticipate setting her up for an MRI scan of the lumbar spine as well. Discussed that if she has any change in her symptoms or any questions to call us as soon as possible.  Follow-Up Instructions: Return in about 2 weeks (around 04/23/2017).   Ortho Exam  Patient is alert, oriented, no adenopathy, well-dressed, normal affect, normal respiratory effort. Examination patient has a normal gait. She states that her heat sensation in the posterior thigh as well as the iliac crest pain that it is present despite position sitting standing or walking. Patient has a negative straight leg raise she has no pain with range of motion of the hip near ankle there is no focal motor weakness. Examination she has tenderness to palpation over the anterior superior iliac spine. The lateral femoral cutaneous nerve distribution is unaffected and is nontender to palpation. Review of her MRI scan from 2  years ago showed no internal derangement of the hip she did have intra-abdominal changes with an ovarian cyst and diverticulosis but did not have any soft tissue mass or bony mass.  Imaging: Ct Abdomen W Wo Contrast  Result Date: 04/09/2017 CLINICAL DATA:  Abnormal LFTs, 25 pound weight loss EXAM: CT ABDOMEN WITHOUT AND WITH CONTRAST TECHNIQUE: Multidetector CT imaging of the abdomen was performed following the standard protocol before and following the bolus administration of intravenous contrast. CONTRAST:  141mL ISOVUE-300 IOPAMIDOL (ISOVUE-300) INJECTION 61% COMPARISON:  None. FINDINGS: Lower chest:  Lung bases are clear. Hepatobiliary: No enhancing hepatic lesion. No biliary duct dilatation. Postcholecystectomy. Portal veins and hepatic veins are patent. Small cyst centrally within the liver. Common bile duct normal caliber Pancreas: Normal pancreatic parenchymal intensity. No ductal dilatation or inflammation. Spleen: Normal spleen. Adrenals/urinary tract: Adrenal glands and kidneys are normal. Stomach/Bowel: Stomach and limited of the small bowel is unremarkable Vascular/Lymphatic: Abdominal aortic normal caliber. No retroperitoneal periportal lymphadenopathy. Musculoskeletal: No aggressive osseous lesion IMPRESSION: 1. Normal liver post cholecystectomy by CT imaging. 2. Normal pancreas and biliary tree. Electronically Signed   By: Suzy Bouchard M.D.   On: 04/09/2017 09:43   No images are attached to the encounter.  Labs: No results found for: HGBA1C, ESRSEDRATE, CRP, LABURIC, REPTSTATUS, GRAMSTAIN, CULT, LABORGA  Orders:  No orders of the defined types were placed in this encounter.  Meds ordered this encounter  Medications  . celecoxib (CELEBREX) 200 MG capsule    Sig: Take 1 capsule (200 mg total) by mouth 2 (two) times daily.    Dispense:  60 capsule    Refill:  3     Procedures: No procedures performed  Clinical Data: No additional findings.  ROS:  All other systems  negative, except as noted in the HPI. Review of Systems  Objective: Vital Signs: Ht 5\' 5"  (1.651 m)   Wt 156 lb (70.8 kg)   BMI 25.96 kg/m   Specialty Comments:  No specialty comments available.  PMFS History: Patient Active Problem List   Diagnosis Date Noted  . Low back pain radiating to right leg 04/09/2017  . 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 Abnormal MRI, pelvis   . Iron deficiency anemia    Past Medical History:  Diagnosis Date  . Fatty liver   . GERD (gastroesophageal reflux disease)   . Headache   . History of hiatal hernia   . 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 Shortness of breath dyspnea    since 08/2014    No family history on file.  Past Surgical History:  Procedure Laterality Date  . ABDOMINAL HYSTERECTOMY    . CHOLECYSTECTOMY     Social History   Occupational History  . Not on file.   Social History Main Topics  . Smoking status: Former Smoker    Quit date: 02/20/2011  . Smokeless tobacco: Never Used     Comment: quit 01/2011  . Alcohol use No  . Drug use: Unknown  . Sexual activity: Yes

## 2017-04-23 ENCOUNTER — Encounter (INDEPENDENT_AMBULATORY_CARE_PROVIDER_SITE_OTHER): Payer: Self-pay | Admitting: Family

## 2017-04-23 ENCOUNTER — Ambulatory Visit (INDEPENDENT_AMBULATORY_CARE_PROVIDER_SITE_OTHER): Payer: BC Managed Care – PPO | Admitting: Family

## 2017-04-23 ENCOUNTER — Ambulatory Visit (INDEPENDENT_AMBULATORY_CARE_PROVIDER_SITE_OTHER): Payer: Self-pay

## 2017-04-23 DIAGNOSIS — M79604 Pain in right leg: Secondary | ICD-10-CM

## 2017-04-23 DIAGNOSIS — M25551 Pain in right hip: Secondary | ICD-10-CM

## 2017-04-23 DIAGNOSIS — M545 Low back pain: Secondary | ICD-10-CM

## 2017-04-23 NOTE — Progress Notes (Signed)
Office Visit Note   Patient: Mercedes Beltran           Date of Birth: [birth year]           MRN: [id number] Visit Date: 04/23/2017              Requested by: Imagene Riches, NP [address] [place], [place] [address] PCP: Imagene Riches, NP  Chief Complaint  Patient presents with  . Right Hip - Pain      HPI: Patient is a 51 year old woman who is been having iliac crest hip pain for over 2 years now. At last appointment was having a feeling of radicular heat sensation in the posterior right thigh. States has been on celebrex 200 mg since last appointment and thigh symptoms have resolved. Hip pain her greatest concern. Nothing eases or worsens pain. Sometimes tender. Feels deep, like "bone pain."  Patient also complains chronic of lower back pain midline, ongoing since an injury in mid 1990s. States did have 2 "slipped discs which resolved with PT in 1995." Patient denies any numbness tingling or weakness. Patient has had an MRI scan of her right hip approximately 2 years ago which was negative.     Assessment & Plan: Visit Diagnoses:  1. Pain in right hip   2. Low back pain radiating to right leg     Plan: Continue Celebrex 200 mg by mouth twice a day. Will refer to Dr. Ernestina Patches for an injection right hip. If this does not help with her hip pain, anticipate setting her up for an MRI scan of the lumbar spine. Discussed that if she has any change in her symptoms or any questions to call us as soon as possible.  Follow-Up Instructions: Return if symptoms worsen or fail to improve.   Ortho Exam  Patient is alert, oriented, no adenopathy, well-dressed, normal affect, normal respiratory effort. Examination patient has a normal gait. Points to anterior iliac crest as most painful area. nontender today. pain that it is present despite position sitting standing or walking. Patient has a negative straight leg raise she has no pain with range of motion of the hip near ankle there is no focal motor  weakness.   Imaging: Xr Lumbar Spine 2-3 Views  Result Date: 04/23/2017 Radiographs of lumbar spine negative for acute finding. No spondylosis or listhesis.  No images are attached to the encounter.  Labs: No results found for: HGBA1C, ESRSEDRATE, CRP, LABURIC, REPTSTATUS, GRAMSTAIN, CULT, LABORGA  Orders:  Orders Placed This Encounter  Procedures  . XR Lumbar Spine 2-3 Views  . Ambulatory referral to Physical Medicine Rehab   No orders of the defined types were placed in this encounter.    Procedures: No procedures performed  Clinical Data: No additional findings.  ROS:  All other systems negative, except as noted in the HPI. Review of Systems  Objective: Vital Signs: There were no vitals taken for this visit.  Specialty Comments:  No specialty comments available.  PMFS History: Patient Active Problem List   Diagnosis Date Noted  . Low back pain radiating to right leg 04/09/2017  . 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 Abnormal MRI, pelvis   . Iron deficiency anemia    Past Medical History:  Diagnosis Date  . Fatty liver   . GERD (gastroesophageal reflux disease)   . Headache   . History of hiatal hernia   . 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 Shortness of breath dyspnea    since 08/2014  History reviewed. No pertinent family history.  Past Surgical History:  Procedure Laterality Date  . ABDOMINAL HYSTERECTOMY    . CHOLECYSTECTOMY     Social History   Occupational History  . Not on file.   Social History Main Topics  . Smoking status: Former Smoker    Quit date: 02/20/2011  . Smokeless tobacco: Never Used     Comment: quit 01/2011  . Alcohol use No  . Drug use: Unknown  . Sexual activity: Yes

## 2017-04-30 ENCOUNTER — Encounter: Payer: Self-pay | Admitting: Cardiology

## 2017-04-30 ENCOUNTER — Ambulatory Visit (INDEPENDENT_AMBULATORY_CARE_PROVIDER_SITE_OTHER): Payer: BC Managed Care – PPO | Admitting: Cardiology

## 2017-04-30 VITALS — BP 132/74 | HR 50 | Ht 65.5 in | Wt 156.2 lb

## 2017-04-30 DIAGNOSIS — Z8249 Family history of ischemic heart disease and other diseases of the circulatory system: Secondary | ICD-10-CM | POA: Diagnosis not present

## 2017-04-30 DIAGNOSIS — E782 Mixed hyperlipidemia: Secondary | ICD-10-CM | POA: Diagnosis not present

## 2017-04-30 DIAGNOSIS — R0609 Other forms of dyspnea: Secondary | ICD-10-CM

## 2017-04-30 DIAGNOSIS — R079 Chest pain, unspecified: Secondary | ICD-10-CM | POA: Diagnosis not present

## 2017-04-30 NOTE — Progress Notes (Signed)
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 female with a hx of anemia of unknown etiology untreated hyperlipidemia and family history of premature coronary artery disease in her grandfather. The patient is a former smoker but quit smoking 6 years ago with significant improvement of her shortness of breath. She has recently noticed worsening shortness of breath now with short distances and also feeling like somebody is sitting on her chest. His results with rest. She denies any palpitations or syncope. No claudications no lower extremity edema other than occasional GERD and a today. No orthopnea or proximal nocturnal dyspnea. She was told in the past that her cholesterol is elevated was never treated. We will obtained from her primary care physician.  Past Medical History:  Diagnosis Date  . Fatty liver   . GERD (gastroesophageal reflux disease)   . Headache   . History of hiatal hernia   . 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 Shortness of breath dyspnea    since 08/2014    Past Surgical History:  Procedure Laterality Date  . ABDOMINAL HYSTERECTOMY    . CHOLECYSTECTOMY      Current Medications: Current Meds  Medication Sig  . celecoxib (CELEBREX) 200 MG capsule Take 1 capsule (200 mg total) by mouth 2 (two) times daily.  . Vitamin D, Ergocalciferol, (DRISDOL) 50000 UNITS CAPS capsule TK 1 C PO ONCE A WEEK FOR 8 WEEKS     Allergies:   Codeine; Demerol [meperidine]; and Morphine and related   Social History   Social History  . Marital status: Married    Spouse name: N/A  . Number of children: N/A  . Years of education: N/A   Social History Main Topics  .  Smoking status: Former Smoker    Quit date: 02/20/2011  . Smokeless tobacco: Never Used     Comment: quit 01/2011  . Alcohol use No  . Drug use: Unknown  . Sexual activity: Yes   Other Topics Concern  . None   Social History Narrative  . None     Family History: The patient's family history is not on file. ROS:   Please see the history of present illness.     All other systems reviewed and are negative.  EKGs/Labs/Other Studies Reviewed:    The following studies were reviewed today:  EKG:  EKG is ordered today.  The ekg ordered today demonstrates Sinus bradycardia otherwise normal EKG no prior EKG available for comparison. Personally reviewed.  Recent Labs: No results found for requested labs within last 8760 hours.  Recent Lipid Panel No results found for: CHOL, TRIG, HDL, CHOLHDL, VLDL, LDLCALC, LDLDIRECT  Physical Exam:    VS:  BP 132/74   Pulse (!) 50   Ht 5' 5.5" (1.664 m)   Wt 156 lb 4 oz (70.9 kg)   SpO2 97%   BMI 25.61 kg/m     Wt Readings from Last 3 Encounters:  04/30/17 156 lb 4 oz (70.9 kg)  04/09/17 156 lb (70.8 kg)  08/23/15 156 lb (70.8 kg)     GEN:  Well nourished, well developed in no acute distress HEENT:  Normal NECK: No JVD; No carotid bruits LYMPHATICS: No lymphadenopathy CARDIAC: RRR, no murmurs, rubs, gallops RESPIRATORY:  Clear to auscultation without rales, wheezing or rhonchi  ABDOMEN: Soft, non-tender, non-distended MUSCULOSKELETAL:  No edema; No deformity  SKIN: Warm and dry NEUROLOGIC:  Alert and oriented x 3 PSYCHIATRIC:  Normal affect   ASSESSMENT:    1. DOE (dyspnea on exertion)   2. Chest pain, unspecified type   3. Family history of early CAD    PLAN:    In order of problems listed above:  1. The patient has typical exertional chest pain suspicious for angina, she has history of smoking untreated hyperlipidemia and family history of coronary artery disease, we will schedule a coronary CTA that exactly evaluate her  coronary arteries, as well as calcium score. We'll perform CT FFR if needed. 2. We will obtain lipid profile from her primary care physician and treated appropriately.   Medication Adjustments/Labs and Tests Ordered: Current medicines are reviewed at length with the patient today.  Concerns regarding medicines are outlined above.  Orders Placed This Encounter  Procedures  . CT CORONARY MORPH W/CTA COR W/SCORE W/CA W/CM &/OR WO/CM  . EKG 12-Lead   No orders of the defined types were placed in this encounter.   Signed, Ena Dawley, MD  04/30/2017 1:46 PM    [place]

## 2017-04-30 NOTE — Patient Instructions (Signed)
Medication Instructions:  Your physician recommends that you continue on your current medications as directed. Please refer to the Current Medication list given to you today.   Labwork: NONE ORDERED   Testing/Procedures: YOU WILL NEED TO BE SCHEDULED FOR A CORONARY CT TO BE DONE AT [facility]  Follow-Up: DR. Meda Coffee IN 2 MONTHS  Any Other Special Instructions Will Be Listed Below (If Applicable).     If you need a refill on your cardiac medications before your next appointment, please call your pharmacy.

## 2017-05-14 ENCOUNTER — Telehealth: Payer: Self-pay | Admitting: *Deleted

## 2017-05-14 ENCOUNTER — Encounter: Payer: Self-pay | Admitting: Cardiology

## 2017-05-14 ENCOUNTER — Other Ambulatory Visit: Payer: BC Managed Care – PPO

## 2017-05-14 ENCOUNTER — Ambulatory Visit (INDEPENDENT_AMBULATORY_CARE_PROVIDER_SITE_OTHER): Payer: BC Managed Care – PPO | Admitting: Physical Medicine and Rehabilitation

## 2017-05-14 DIAGNOSIS — R0609 Other forms of dyspnea: Secondary | ICD-10-CM

## 2017-05-14 DIAGNOSIS — R0600 Dyspnea, unspecified: Secondary | ICD-10-CM

## 2017-05-14 HISTORY — DX: Other forms of dyspnea: R06.09

## 2017-05-14 HISTORY — DX: Dyspnea, unspecified: R06.00

## 2017-05-14 NOTE — Telephone Encounter (Signed)
05/14/17 CARDIAC CT---05/28/17 AT 0930, PLEASE PLACE LAB ODERS.  THANKS  Received: Today  Message Contents  Juliette Alcide, LPN    Pts coronary ct is scheduled for 05/28/17.  Pt to come in for lab to have a BMET done per CT protocol, on 9/14.

## 2017-05-21 ENCOUNTER — Other Ambulatory Visit: Payer: BC Managed Care – PPO | Admitting: *Deleted

## 2017-05-21 ENCOUNTER — Other Ambulatory Visit: Payer: Self-pay | Admitting: Gastroenterology

## 2017-05-21 DIAGNOSIS — R0609 Other forms of dyspnea: Principal | ICD-10-CM

## 2017-05-21 DIAGNOSIS — R1310 Dysphagia, unspecified: Secondary | ICD-10-CM

## 2017-05-22 LAB — BASIC METABOLIC PANEL
BUN/Creatinine Ratio: 10 (ref 9–23)
BUN: 7 mg/dL (ref 6–24)
CO2: 22 mmol/L (ref 20–29)
Calcium: 9.3 mg/dL (ref 8.7–10.2)
Chloride: 103 mmol/L (ref 96–106)
Creatinine, Ser: 0.68 mg/dL (ref 0.57–1.00)
GFR calc Af Amer: 116 mL/min/{1.73_m2} (ref >59)
GFR calc non Af Amer: 101 mL/min/{1.73_m2} (ref >59)
Glucose: 84 mg/dL (ref 65–99)
Potassium: 4.4 mmol/L (ref 3.5–5.2)
Sodium: 141 mmol/L (ref 134–144)

## 2017-05-24 ENCOUNTER — Other Ambulatory Visit: Payer: BC Managed Care – PPO

## 2017-05-28 ENCOUNTER — Ambulatory Visit
Admission: RE | Admit: 2017-05-28 | Discharge: 2017-05-28 | Disposition: A | Discharge status: Home / Self Care | Payer: BC Managed Care – PPO | Source: Ambulatory Visit | Attending: Gastroenterology | Admitting: Gastroenterology

## 2017-05-28 ENCOUNTER — Ambulatory Visit (HOSPITAL_COMMUNITY)
Admission: RE | Admit: 2017-05-28 | Discharge: 2017-05-28 | Disposition: A | Discharge status: Home / Self Care | Payer: BC Managed Care – PPO | Source: Ambulatory Visit | Attending: Cardiology | Admitting: Cardiology

## 2017-05-28 ENCOUNTER — Encounter (HOSPITAL_COMMUNITY): Payer: Self-pay

## 2017-05-28 ENCOUNTER — Other Ambulatory Visit: Payer: BC Managed Care – PPO

## 2017-05-28 DIAGNOSIS — R079 Chest pain, unspecified: Secondary | ICD-10-CM | POA: Insufficient documentation

## 2017-05-28 DIAGNOSIS — R0609 Other forms of dyspnea: Secondary | ICD-10-CM | POA: Diagnosis not present

## 2017-05-28 DIAGNOSIS — R0789 Other chest pain: Secondary | ICD-10-CM | POA: Diagnosis not present

## 2017-05-28 DIAGNOSIS — R1310 Dysphagia, unspecified: Secondary | ICD-10-CM

## 2017-05-28 DIAGNOSIS — I281 Aneurysm of pulmonary artery: Secondary | ICD-10-CM | POA: Diagnosis not present

## 2017-05-28 MED ORDER — NITROGLYCERIN 0.4 MG SL SUBL
SUBLINGUAL_TABLET | SUBLINGUAL | Status: AC
  Administered 2017-05-28: 0.8 mg via SUBLINGUAL
  Filled 2017-05-28: qty 2

## 2017-05-28 MED ORDER — IOPAMIDOL (ISOVUE-370) INJECTION 76%
INTRAVENOUS | Status: AC
  Administered 2017-05-28: 80 mL
  Filled 2017-05-28: qty 100

## 2017-05-28 MED ORDER — NITROGLYCERIN 0.4 MG SL SUBL
0.8000 mg | SUBLINGUAL_TABLET | Freq: Once | SUBLINGUAL | Status: AC
  Administered 2017-05-28: 0.8 mg via SUBLINGUAL

## 2017-06-01 ENCOUNTER — Telehealth: Payer: Self-pay | Admitting: Cardiology

## 2017-06-01 DIAGNOSIS — R0609 Other forms of dyspnea: Principal | ICD-10-CM

## 2017-06-01 NOTE — Telephone Encounter (Signed)
Attempted to call the pt back and no answer, and VM is full.

## 2017-06-01 NOTE — Telephone Encounter (Signed)
-----   Message from Dorothy Spark, MD sent at 05/28/2017  5:45 PM EDT ----- She has calcium score of 0 and normal coronary arteries. I would suggest PFTs.

## 2017-06-01 NOTE — Telephone Encounter (Signed)
New message     Pt was returning call, she works at the [employer] can not take her phone in, would really like to speak with someone

## 2017-06-01 NOTE — Telephone Encounter (Signed)
Attempted to call the pt back.  VM still full.  3rd attempt at trying to make contact with the pt.

## 2017-06-01 NOTE — Telephone Encounter (Signed)
Spoke with the pt and informed her that per Dr Meda Coffee, her coronary ct showed that her calcium score was 0 and she had normal coronary arteries. Informed the pt that per Dr Meda Coffee, she recommends that we order PFTs on the pt, to assess her DOE. Informed the pt that I will place the order in the system, and have a [facility] scheduler call her back to arrange this appt.  Pt verbalized understanding and agrees with this plan.

## 2017-06-11 ENCOUNTER — Encounter (INDEPENDENT_AMBULATORY_CARE_PROVIDER_SITE_OTHER): Payer: Self-pay | Admitting: Physical Medicine and Rehabilitation

## 2017-06-11 ENCOUNTER — Ambulatory Visit (INDEPENDENT_AMBULATORY_CARE_PROVIDER_SITE_OTHER): Payer: BC Managed Care – PPO

## 2017-06-11 ENCOUNTER — Ambulatory Visit (HOSPITAL_COMMUNITY)
Admission: RE | Admit: 2017-06-11 | Discharge: 2017-06-11 | Disposition: A | Discharge status: Home / Self Care | Payer: BC Managed Care – PPO | Source: Ambulatory Visit | Attending: Cardiology | Admitting: Cardiology

## 2017-06-11 ENCOUNTER — Telehealth (INDEPENDENT_AMBULATORY_CARE_PROVIDER_SITE_OTHER): Payer: Self-pay | Admitting: Family

## 2017-06-11 ENCOUNTER — Ambulatory Visit (INDEPENDENT_AMBULATORY_CARE_PROVIDER_SITE_OTHER): Payer: BC Managed Care – PPO | Admitting: Physical Medicine and Rehabilitation

## 2017-06-11 DIAGNOSIS — R0609 Other forms of dyspnea: Secondary | ICD-10-CM | POA: Diagnosis present

## 2017-06-11 DIAGNOSIS — M25551 Pain in right hip: Secondary | ICD-10-CM | POA: Diagnosis not present

## 2017-06-11 LAB — PULMONARY FUNCTION TEST
DL/VA % pred: 74 %
DL/VA: 3.69 ml/min/mmHg/L
DLCO unc % pred: 74 %
DLCO unc: 19.05 ml/min/mmHg
FEF 25-75 Post: 1.4 L/sec
FEF 25-75 Pre: 1.02 L/sec
FEF2575-%Change-Post: 36 %
FEF2575-%Pred-Post: 51 %
FEF2575-%Pred-Pre: 37 %
FEV1-%Change-Post: 9 %
FEV1-%Pred-Post: 82 %
FEV1-%Pred-Pre: 75 %
FEV1-Post: 2.36 L
FEV1-Pre: 2.16 L
FEV1FVC-%Change-Post: 4 %
FEV1FVC-%Pred-Pre: 75 %
FEV6-%Change-Post: 8 %
FEV6-%Pred-Post: 103 %
FEV6-%Pred-Pre: 95 %
FEV6-Post: 3.62 L
FEV6-Pre: 3.34 L
FEV6FVC-%Change-Post: 3 %
FEV6FVC-%Pred-Post: 100 %
FEV6FVC-%Pred-Pre: 96 %
FVC-%Change-Post: 4 %
FVC-%Pred-Post: 103 %
FVC-%Pred-Pre: 98 %
FVC-Post: 3.73 L
FVC-Pre: 3.57 L
Post FEV1/FVC ratio: 63 %
Post FEV6/FVC ratio: 97 %
Pre FEV1/FVC ratio: 60 %
Pre FEV6/FVC Ratio: 94 %
RV % pred: 137 %
RV: 2.58 L
TLC % pred: 117 %
TLC: 6.13 L

## 2017-06-11 MED ORDER — ALBUTEROL SULFATE (2.5 MG/3ML) 0.083% IN NEBU
2.5000 mg | INHALATION_SOLUTION | Freq: Once | RESPIRATORY_TRACT | Status: AC
  Administered 2017-06-11: 2.5 mg via RESPIRATORY_TRACT

## 2017-06-11 NOTE — Progress Notes (Signed)
Mercedes Beltran - 51 y.o. female MRN [id number]  Date of birth: [birth year]  Office Visit Note: Visit Date: 06/11/2017 PCP: Imagene Riches, NP Referred by: Imagene Riches, NP  Subjective: Chief Complaint  Patient presents with  . Right Hip - Pain   HPI: Mercedes Beltran is a 51 year old female who has been evaluated by Mercedes Beltran, FN-P and Dr. Sharol Given. She is having right hip pain which is more posterior and lateral with some referral to the anterior upper thigh. No real referral further to the groin area. No paresthesia. History of prior lumbar issues. They requested diagnostic of a therapeutic anesthetic hip arthrogram and injection.    ROS Otherwise per HPI.  Assessment & Plan: Visit Diagnoses:  1. Pain in right hip     Plan: Findings:  Diagnostic and therapeutic right hip anesthetic arthrogram. The patient did not have much relief during the anesthetic phase of the injection.    Meds & Orders: No orders of the defined types were placed in this encounter.   Orders Placed This Encounter  Procedures  . XR C-ARM NO REPORT    Follow-up: No Follow-up on file.   Procedures: Large Joint Inj Date/Time: 06/11/2017 10:34 AM Performed by: Magnus Sinning Authorized by: Magnus Sinning   Consent Given by:  Patient Site marked: the procedure site was marked   Timeout: prior to procedure the correct patient, procedure, and site was verified   Indications:  Pain and diagnostic evaluation Location:  Hip Site:  R hip joint Prep: patient was prepped and draped in usual sterile fashion   Needle Size:  22 G Approach:  Anterior Ultrasound Guidance: No   Fluoroscopic Guidance: No   Arthrogram: Yes   Medications:  3 mL bupivacaine 0.5 %; 80 mg triamcinolone acetonide 40 MG/ML Aspiration Attempted: Yes   Patient tolerance:  Patient tolerated the procedure well with no immediate complications  Arthrogram demonstrated excellent flow of contrast throughout the joint surface without extravasation  or obvious defect.  The patient did not relief of symptoms during the anesthetic phase of the injection.      No notes on file   Clinical History: No specialty comments available.  She reports that she quit smoking about 6 years ago. She has never used smokeless tobacco. No results for input(s): HGBA1C, LABURIC in the last 8760 hours.  Objective:  VS:  HT:    WT:   BMI:     BP:   HR: bpm  TEMP: ( )  RESP:  Physical Exam  Musculoskeletal:  Patient reports pain more over the right iliac crest and low back. She is some pain over the greater trochanters. She has no groin pain with hip rotation. She has good distal strength.    Ortho Exam Imaging: No results found.  Past Medical/Family/Surgical/Social History: Medications & Allergies reviewed per EMR Patient Active Problem List   Diagnosis Date Noted  . DOE (dyspnea on exertion) 05/14/2017  . Low back pain radiating to right leg 04/09/2017  . 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 Abnormal MRI, pelvis   . Iron deficiency anemia    Past Medical History:  Diagnosis Date  . Fatty liver   . GERD (gastroesophageal reflux disease)   . Headache   . History of hiatal hernia   . 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 Shortness of breath dyspnea    since 08/2014   History reviewed. No pertinent family history. Past Surgical History:  Procedure Laterality Date  . ABDOMINAL HYSTERECTOMY    .  CHOLECYSTECTOMY     Social History   Occupational History  . Not on file.   Social History Main Topics  . Smoking status: Former Smoker    Quit date: 02/20/2011  . Smokeless tobacco: Never Used     Comment: quit 01/2011  . Alcohol use No  . Drug use: Unknown  . Sexual activity: Yes

## 2017-06-11 NOTE — Patient Instructions (Signed)

## 2017-06-11 NOTE — Telephone Encounter (Signed)
I called patient and made follow up with Dr. Lorin Mercy for 06/25/2017 at 3pm.

## 2017-06-11 NOTE — Progress Notes (Deleted)
Right hip pain. The pain is constant and does not change with activity or position. Pain is anterior, but does not point to groin area.

## 2017-06-14 MED ORDER — BUPIVACAINE HCL 0.5 % IJ SOLN
3.0000 mL | INTRAMUSCULAR | Status: AC | PRN
  Administered 2017-06-11: 3 mL via INTRA_ARTICULAR

## 2017-06-14 MED ORDER — TRIAMCINOLONE ACETONIDE 40 MG/ML IJ SUSP
80.0000 mg | INTRAMUSCULAR | Status: AC | PRN
  Administered 2017-06-11: 80 mg via INTRA_ARTICULAR

## 2017-06-18 ENCOUNTER — Ambulatory Visit (HOSPITAL_COMMUNITY): Payer: BC Managed Care – PPO | Attending: Cardiovascular Disease

## 2017-06-18 ENCOUNTER — Encounter: Payer: Self-pay | Admitting: Cardiology

## 2017-06-18 ENCOUNTER — Other Ambulatory Visit: Payer: Self-pay

## 2017-06-18 ENCOUNTER — Ambulatory Visit (INDEPENDENT_AMBULATORY_CARE_PROVIDER_SITE_OTHER): Payer: BC Managed Care – PPO | Admitting: Cardiology

## 2017-06-18 VITALS — BP 116/62 | HR 57 | Ht 65.5 in | Wt 152.0 lb

## 2017-06-18 DIAGNOSIS — E782 Mixed hyperlipidemia: Secondary | ICD-10-CM | POA: Diagnosis not present

## 2017-06-18 DIAGNOSIS — Z8249 Family history of ischemic heart disease and other diseases of the circulatory system: Secondary | ICD-10-CM

## 2017-06-18 DIAGNOSIS — R0600 Dyspnea, unspecified: Secondary | ICD-10-CM | POA: Insufficient documentation

## 2017-06-18 DIAGNOSIS — Z87891 Personal history of nicotine dependence: Secondary | ICD-10-CM | POA: Insufficient documentation

## 2017-06-18 DIAGNOSIS — R5383 Other fatigue: Secondary | ICD-10-CM | POA: Insufficient documentation

## 2017-06-18 DIAGNOSIS — R079 Chest pain, unspecified: Secondary | ICD-10-CM

## 2017-06-18 DIAGNOSIS — D649 Anemia, unspecified: Secondary | ICD-10-CM | POA: Insufficient documentation

## 2017-06-18 DIAGNOSIS — R0609 Other forms of dyspnea: Secondary | ICD-10-CM

## 2017-06-18 LAB — ECHOCARDIOGRAM COMPLETE
Height: 65.5 in
Weight: 2432 oz

## 2017-06-18 NOTE — Patient Instructions (Signed)
Medication Instructions:   Your physician recommends that you continue on your current medications as directed. Please refer to the Current Medication list given to you today.     Testing/Procedures:  Your physician has requested that you have an echocardiogram. Echocardiography is a painless test that uses sound waves to create images of your heart. It provides your doctor with information about the size and shape of your heart and how well your heart's chambers and valves are working. This procedure takes approximately one hour. There are no restrictions for this procedure. ECHO TODAY PER DR NELSON     Follow-Up:  AS NEEDED WITH DR Meda Coffee     If you need a refill on your cardiac medications before your next appointment, please call your pharmacy.

## 2017-06-18 NOTE — Progress Notes (Signed)
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 female with a hx of anemia of unknown etiology untreated hyperlipidemia and family history of premature coronary artery disease in her grandfather. The patient is a former smoker but quit smoking 6 years ago with significant improvement of her shortness of breath. She has recently noticed worsening shortness of breath now with short distances and also feeling like somebody is sitting on her chest. His results with rest. She denies any palpitations or syncope. No claudications no lower extremity edema other than occasional GERD and a today. No orthopnea or proximal nocturnal dyspnea. She was told in the past that her cholesterol is elevated was never treated. We will obtained from her primary care physician.  06/18/2017, she is coming after 1 months, she continues to feel profoundly tired. 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 She is currently on antidepressant that she started to take 2 months ago cured he felt tired before that. Her TSH was checked and normal. She feels short of breath at rest and on exertion with occasional chest pain are not related to exertion.  Past Medical History:  Diagnosis Date  . Fatty liver   . GERD (gastroesophageal reflux disease)   . Headache   . History of hiatal hernia   . 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 Shortness of breath dyspnea    since 08/2014    Past Surgical History:  Procedure Laterality Date  . ABDOMINAL HYSTERECTOMY    . CHOLECYSTECTOMY      Current Medications: Current Meds  Medication Sig  . celecoxib (CELEBREX) 200 MG capsule Take 1  capsule (200 mg total) by mouth 2 (two) times daily.  . MULTIPLE VITAMIN PO Take 1 tablet by mouth daily.  . Vitamin D, Ergocalciferol, (DRISDOL) 50000 UNITS CAPS capsule TK 1 C PO ONCE A WEEK FOR 8 WEEKS     Allergies:   Codeine; Demerol [meperidine]; and Morphine and related   Social History   Social History  . Marital status: Married    Spouse name: N/A  . Number of children: N/A  . Years of education: N/A   Social History Main Topics  . Smoking status: Former Smoker    Quit date: 02/20/2011  . Smokeless tobacco: Never Used     Comment: quit 01/2011  . Alcohol use No  . Drug use: Unknown  . Sexual activity: Yes   Other Topics Concern  . None   Social History Narrative  . None     Family History: The patient's family history is not on file. ROS:   Please see the history of present illness.     All other systems reviewed and are negative.  EKGs/Labs/Other Studies Reviewed:    The following studies were reviewed today:  EKG:  EKG is ordered today.  The ekg ordered today demonstrates Sinus bradycardia otherwise normal EKG no prior EKG available for comparison. Personally reviewed.  Recent Labs: 05/21/2017: BUN 7; Creatinine, Ser 0.68; Potassium 4.4; Sodium 141  Recent Lipid Panel No results found for: CHOL, TRIG, HDL, CHOLHDL, VLDL, LDLCALC, LDLDIRECT  Physical Exam:  VS:  BP 116/62   Pulse (!) 57   Ht 5' 5.5" (1.664 m)   Wt 152 lb (68.9 kg)   SpO2 99%   BMI 24.91 kg/m     Wt Readings from Last 3 Encounters:  06/18/17 152 lb (68.9 kg)  04/30/17 156 lb 4 oz (70.9 kg)  04/09/17 156 lb (70.8 kg)     GEN:  Well nourished, well developed in no acute distress HEENT: Normal NECK: No JVD; No carotid bruits LYMPHATICS: No lymphadenopathy CARDIAC: RRR, no murmurs, rubs, gallops RESPIRATORY:  Clear to auscultation without rales, wheezing or rhonchi  ABDOMEN: Soft, non-tender, non-distended MUSCULOSKELETAL:  No edema; No deformity  SKIN: Warm and  dry NEUROLOGIC:  Alert and oriented x 3 PSYCHIATRIC:  Normal affect     ASSESSMENT:    1. Chest pain, unspecified type   2. Fatigue, unspecified type      PLAN:    In order of problems listed above:  1. Shortness of breath and exertional dyspnea, she underwent coronary CT that showed calcium score of 0 and no plaque. Her pulmonary artery was minimally dilated root 31 mm so LFTs were ordered that showed minimal obstructive airway disease and minimal diffusion reduction. Her TSH was normal, she was previously anemic but received iron infusions and her most recent hemoglobin from last month was 15.4. Her EKG was normal. Going to check echocardiogram for systolic and diastolic function. Clinically she doesn't have any signs of heart failure. She is advised to consider a less stressful job. 2. Lipid profile obtained from her primary care physician shows LDL 99, HDL 35, triglycerides 165, she is advised to buy over-the-counter fish oil, she is also advised to increase exercise level for both fatigue and cholesterol management.     Medication Adjustments/Labs and Tests Ordered: Current medicines are reviewed at length with the patient today.  Concerns regarding medicines are outlined above.  Orders Placed This Encounter  Procedures  . ECHOCARDIOGRAM COMPLETE   No orders of the defined types were placed in this encounter.   Signed, Ena Dawley, MD  06/18/2017 9:11 AM    [place]

## 2017-06-25 ENCOUNTER — Ambulatory Visit (INDEPENDENT_AMBULATORY_CARE_PROVIDER_SITE_OTHER): Payer: BC Managed Care – PPO | Admitting: Orthopaedic Surgery

## 2017-06-25 ENCOUNTER — Telehealth: Payer: Self-pay | Admitting: Cardiology

## 2017-06-25 ENCOUNTER — Ambulatory Visit (INDEPENDENT_AMBULATORY_CARE_PROVIDER_SITE_OTHER): Payer: BC Managed Care – PPO

## 2017-06-25 ENCOUNTER — Encounter (INDEPENDENT_AMBULATORY_CARE_PROVIDER_SITE_OTHER): Payer: Self-pay | Admitting: Orthopaedic Surgery

## 2017-06-25 VITALS — BP 106/70 | HR 69 | Ht 65.5 in | Wt 150.0 lb

## 2017-06-25 DIAGNOSIS — M47816 Spondylosis without myelopathy or radiculopathy, lumbar region: Secondary | ICD-10-CM | POA: Diagnosis not present

## 2017-06-25 DIAGNOSIS — M25551 Pain in right hip: Secondary | ICD-10-CM | POA: Diagnosis not present

## 2017-06-25 NOTE — Telephone Encounter (Signed)
New Message     Patient checking on her Echo results

## 2017-06-25 NOTE — Progress Notes (Signed)
Office Visit Note   Patient: Mercedes Beltran           Date of Birth: [birth year]           MRN: [id number] Visit Date: 06/25/2017              Requested by: Imagene Riches, NP [address] [place], [place] [address] PCP: Imagene Riches, NP   Assessment & Plan: Visit Diagnoses:  1. Pain in right hip   2. Facet degeneration of lumbar region     Plan: I discussed with the patient that she has some degenerative facet changes on the right side with narrowing of L5-S1. No evidence radiculopathy. She is probably having some referred pain from the facet arthropathy and the if her symptoms get worse she will call with proceed with an MRI scan.  Follow-Up Instructions: No Follow-up on file.   Orders:  Orders Placed This Encounter  Procedures  . XR HIP UNILAT W OR W/O PELVIS 2-3 VIEWS RIGHT   No orders of the defined types were placed in this encounter.     Procedures: No procedures performed   Clinical Data: No additional findings.   Subjective: Chief Complaint  Patient presents with  . Right Hip - Pain    HPI 51 year old female referred by Dondra Prader NP for continued right anterolateral hip pain and low back pain. She had previous hip injection without relief. Previous MRI P years ago of her hip which was normal. She does use Celebrex with slight improvement. She doesn't the limp, denies bowel bladder symptoms no fever or chills.  Review of Systems process for iron malabsorption, iron deficiency anemia low back pain.   Objective: Vital Signs: BP 106/70   Pulse 69   Ht 5' 5.5" (1.664 m)   Wt 150 lb (68 kg)   BMI 24.58 kg/m   Physical Exam  Constitutional: She is oriented to person, place, and time. She appears well-developed.  HENT:  Head: Normocephalic.  Right Ear: External ear normal.  Left Ear: External ear normal.  Eyes: Pupils are equal, round, and reactive to light.  Neck: No tracheal deviation present. No thyromegaly present.  Cardiovascular: Normal rate.     Pulmonary/Chest: Effort normal.  Abdominal: Soft.  Neurological: She is alert and oriented to person, place, and time.  Skin: Skin is warm and dry.  Psychiatric: She has a normal mood and affect. Her behavior is normal.    Ortho Exam patient ambulates without a hip limp negative Trendelenburg no trochanteric bursal tenderness. Some tenderness over the anterolateral hip region without iliopsoas weakness no quad weakness reflected head of the rectus is normal. No pain with straight leg raising in the supine position knee reverse straight leg raising negative popped of compression tests. No pain with extremes hip range of motion. Chest some sciatic notch tenderness on the right pain with palpation over lumbosacral junction L4 to the S1 and over the right lower to facet joints with palpation. No inguinal lymphadenopathy. No masses in the groin ASIS is normal lateral femoral cutaneous nerve is normal.  Specialty Comments:  No specialty comments available.  Imaging: No results found.   PMFS History: Patient Active Problem List   Diagnosis Date Noted  . DOE (dyspnea on exertion) 05/14/2017  . Low back pain radiating to right leg 04/09/2017  . 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 Abnormal MRI, pelvis   . Iron deficiency anemia    Past Medical History:  Diagnosis Date  . Fatty liver   .  GERD (gastroesophageal reflux disease)   . Headache   . History of hiatal hernia   . 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 Shortness of breath dyspnea    since 08/2014    No family history on file.  Past Surgical History:  Procedure Laterality Date  . ABDOMINAL HYSTERECTOMY    . CHOLECYSTECTOMY     Social History   Occupational History  . Not on file.   Social History Main Topics  . Smoking status: Former Smoker    Quit date: 02/20/2011  . Smokeless tobacco: Never Used     Comment: quit 01/2011  . Alcohol use No  . Drug use: Unknown  . Sexual activity: Yes

## 2017-06-25 NOTE — Telephone Encounter (Signed)
Normal chamber size and function.

## 2017-06-25 NOTE — Telephone Encounter (Signed)
Notified the pt that per Dr Meda Coffee, her echo showed normal chamber size and function.  Pt verbalized understanding.

## 2017-06-25 NOTE — Telephone Encounter (Signed)
Pt is calling to request her echo results from 10/19.  Appears that the pts echo was never routed to Dr Meda Coffee to review and advise on.  Routed pts echo to Dr Meda Coffee to result. Informed the pt that once Dr Meda Coffee results her echo, I will follow-up with her shortly thereafter.  Pt verbalized understanding and agrees with this plan.

## 2017-11-09 ENCOUNTER — Other Ambulatory Visit: Payer: Self-pay | Admitting: Family

## 2017-11-09 DIAGNOSIS — Z1231 Encounter for screening mammogram for malignant neoplasm of breast: Secondary | ICD-10-CM

## 2017-11-10 ENCOUNTER — Ambulatory Visit
Admission: RE | Admit: 2017-11-10 | Discharge: 2017-11-10 | Disposition: A | Discharge status: Home / Self Care | Payer: BC Managed Care – PPO | Source: Ambulatory Visit | Attending: Family | Admitting: Family

## 2017-11-10 DIAGNOSIS — Z1231 Encounter for screening mammogram for malignant neoplasm of breast: Secondary | ICD-10-CM

## 2018-10-14 ENCOUNTER — Other Ambulatory Visit: Payer: Self-pay | Admitting: Family

## 2018-10-14 DIAGNOSIS — Z1231 Encounter for screening mammogram for malignant neoplasm of breast: Secondary | ICD-10-CM

## 2018-11-17 ENCOUNTER — Inpatient Hospital Stay: Admission: RE | Admit: 2018-11-17 | Payer: BC Managed Care – PPO | Source: Ambulatory Visit

## 2018-11-25 ENCOUNTER — Other Ambulatory Visit: Payer: Self-pay | Admitting: Gastroenterology

## 2018-11-25 DIAGNOSIS — R1310 Dysphagia, unspecified: Secondary | ICD-10-CM

## 2019-01-16 ENCOUNTER — Other Ambulatory Visit: Payer: Self-pay

## 2019-01-16 ENCOUNTER — Ambulatory Visit
Admission: RE | Admit: 2019-01-16 | Discharge: 2019-01-16 | Disposition: A | Discharge status: Home / Self Care | Payer: BC Managed Care – PPO | Source: Ambulatory Visit | Attending: Gastroenterology | Admitting: Gastroenterology

## 2019-01-16 DIAGNOSIS — R1310 Dysphagia, unspecified: Secondary | ICD-10-CM

## 2019-01-20 ENCOUNTER — Other Ambulatory Visit: Payer: BC Managed Care – PPO

## 2019-02-02 ENCOUNTER — Other Ambulatory Visit: Payer: Self-pay

## 2019-02-02 ENCOUNTER — Ambulatory Visit
Admission: RE | Admit: 2019-02-02 | Discharge: 2019-02-02 | Disposition: A | Discharge status: Home / Self Care | Payer: BC Managed Care – PPO | Source: Ambulatory Visit | Attending: Family | Admitting: Family

## 2019-02-02 DIAGNOSIS — Z1231 Encounter for screening mammogram for malignant neoplasm of breast: Secondary | ICD-10-CM

## 2019-06-08 ENCOUNTER — Emergency Department (HOSPITAL_COMMUNITY)
Admission: EM | Admit: 2019-06-08 | Discharge: 2019-06-09 | Disposition: A | Discharge status: Home / Self Care | Payer: BC Managed Care – PPO | Attending: Emergency Medicine | Admitting: Emergency Medicine

## 2019-06-08 ENCOUNTER — Other Ambulatory Visit: Payer: Self-pay

## 2019-06-08 ENCOUNTER — Emergency Department (HOSPITAL_COMMUNITY): Payer: BC Managed Care – PPO

## 2019-06-08 ENCOUNTER — Encounter (HOSPITAL_COMMUNITY): Payer: Self-pay | Admitting: Emergency Medicine

## 2019-06-08 DIAGNOSIS — Z79899 Other long term (current) drug therapy: Secondary | ICD-10-CM | POA: Insufficient documentation

## 2019-06-08 DIAGNOSIS — R072 Precordial pain: Secondary | ICD-10-CM | POA: Insufficient documentation

## 2019-06-08 DIAGNOSIS — R0789 Other chest pain: Secondary | ICD-10-CM | POA: Diagnosis present

## 2019-06-08 DIAGNOSIS — Z87891 Personal history of nicotine dependence: Secondary | ICD-10-CM | POA: Insufficient documentation

## 2019-06-08 DIAGNOSIS — R0602 Shortness of breath: Secondary | ICD-10-CM | POA: Diagnosis not present

## 2019-06-08 DIAGNOSIS — R079 Chest pain, unspecified: Secondary | ICD-10-CM

## 2019-06-08 LAB — I-STAT BETA HCG BLOOD, ED (MC, WL, AP ONLY): I-stat hCG, quantitative: <5 m[IU]/mL (ref <5)

## 2019-06-08 LAB — BASIC METABOLIC PANEL
Anion gap: 9 (ref 5–15)
BUN: 9 mg/dL (ref 6–20)
CO2: 26 mmol/L (ref 22–32)
Calcium: 8.9 mg/dL (ref 8.9–10.3)
Chloride: 102 mmol/L (ref 98–111)
Creatinine, Ser: 0.75 mg/dL (ref 0.44–1.00)
GFR calc Af Amer: >60 mL/min (ref >60)
GFR calc non Af Amer: >60 mL/min (ref >60)
Glucose, Bld: 103 mg/dL — ABNORMAL HIGH (ref 70–99)
Potassium: 3.7 mmol/L (ref 3.5–5.1)
Sodium: 137 mmol/L (ref 135–145)

## 2019-06-08 LAB — CBC
HCT: 44.8 % (ref 36.0–46.0)
Hemoglobin: 14.8 g/dL (ref 12.0–15.0)
MCH: 31 pg (ref 26.0–34.0)
MCHC: 33 g/dL (ref 30.0–36.0)
MCV: 93.7 fL (ref 80.0–100.0)
Platelets: 333 10*3/uL (ref 150–400)
RBC: 4.78 MIL/uL (ref 3.87–5.11)
RDW: 12.8 % (ref 11.5–15.5)
WBC: 7.6 10*3/uL (ref 4.0–10.5)
nRBC: 0 % (ref 0.0–0.2)

## 2019-06-08 LAB — TROPONIN I (HIGH SENSITIVITY): Troponin I (High Sensitivity): 3 ng/L (ref <18)

## 2019-06-08 MED ORDER — SODIUM CHLORIDE 0.9% FLUSH: 3.0000 mL | Freq: Once | INTRAVENOUS | Status: DC

## 2019-06-08 NOTE — ED Triage Notes (Signed)
Pt c/o intermittent chest pain and shortness of breath x 1 day. Denies pain at this time. States she had a recent COVID test that was negative.

## 2019-06-09 LAB — TROPONIN I (HIGH SENSITIVITY): Troponin I (High Sensitivity): 3 ng/L (ref <18)

## 2019-06-09 NOTE — ED Notes (Signed)
Patient verbalizes understanding of discharge instructions. Opportunity for questioning and answers were provided. Armband removed by staff, pt discharged from ED ambulatory.   

## 2019-06-09 NOTE — ED Provider Notes (Signed)
[facility]ER[facility]ENCY DEPARTMENT Provider Note  CSN: [id number] Arrival date & time: 06/08/19 1555  Chief Complaint(s) Chest Pain and Shortness of Breath  HPI Mercedes Beltran is a 53 y.o. female   The history is provided by the patient.  Chest Pain Pain location:  Substernal area and R chest Pain quality: pressure   Pain radiates to:  R arm Pain severity:  Mild Onset quality:  Sudden Duration:  4 minutes Timing:  Intermittent Progression:  Resolved (pain stopped several hours ago.) Chronicity:  Recurrent Relieved by:  Nothing (self resolved) Worsened by:  Nothing Associated symptoms: palpitations and shortness of breath   Associated symptoms: no altered mental status, no anorexia, no cough, no fatigue, no fever and no nausea   Risk factors: no coronary artery disease, no diabetes mellitus, no high cholesterol, no hypertension, not female and no prior DVT/PE   Shortness of Breath Associated symptoms: chest pain   Associated symptoms: no cough and no fever    Reports that she recently had Holter monitor for palpitations, but does not know the results yet.   Past Medical History Past Medical History:  Diagnosis Date  . Fatty liver   . GERD (gastroesophageal reflux disease)   . Headache   . History of hiatal hernia   . 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 Shortness of breath dyspnea    since 08/2014   Patient Active Problem List   Diagnosis Date Noted  . DOE (dyspnea on exertion) 05/14/2017  . Low back pain radiating to right leg 04/09/2017  . 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 Abnormal MRI, pelvis   . Iron deficiency anemia    Home Medication(s) Prior to Admission medications   Medication Sig Start Date End Date Taking? Authorizing Provider  celecoxib (CELEBREX) 200 MG capsule Take 1 capsule (200 mg total) by mouth 2 (two) times daily. 04/09/17   Newt Minion, MD  MULTIPLE VITAMIN PO Take 1 tablet by mouth daily.    [provider]  Vitamin D,  Ergocalciferol, (DRISDOL) 50000 UNITS CAPS capsule TK 1 C PO ONCE A WEEK FOR 8 WEEKS 06/14/15   [provider]                                                                                                                                    Past Surgical History Past Surgical History:  Procedure Laterality Date  . ABDOMINAL HYSTERECTOMY    . CHOLECYSTECTOMY     Family History Family History  Problem Relation Age of Onset  . Breast cancer Paternal Aunt        x6, not sure of ages of onset    Social History Social History   Tobacco Use  . Smoking status: Former Smoker    Quit date: 02/20/2011    Years since quitting: 8.3  . Smokeless tobacco: Never Used  . Tobacco comment: quit 01/2011  Substance Use Topics  .  Alcohol use: No    Alcohol/week: 0.0 standard drinks  . Drug use: Not on file   Allergies Codeine, Demerol [meperidine], and Morphine and related  Review of Systems Review of Systems  Constitutional: Negative for fatigue and fever.  Respiratory: Positive for shortness of breath. Negative for cough.   Cardiovascular: Positive for chest pain and palpitations.  Gastrointestinal: Negative for anorexia and nausea.   All other systems are reviewed and are negative for acute change except as noted in the HPI  Physical Exam Vital Signs  I have reviewed the triage vital signs BP 122/72   Pulse 65   Temp 98 F (36.7 C) (Oral)   Resp 16   SpO2 96%   Physical Exam Vitals signs reviewed.  Constitutional:      General: She is not in acute distress.    Appearance: She is well-developed. She is not diaphoretic.  HENT:     Head: Normocephalic and atraumatic.     Nose: Nose normal.  Eyes:     General: No scleral icterus.       Right eye: No discharge.        Left eye: No discharge.     Conjunctiva/sclera: Conjunctivae normal.     Pupils: Pupils are equal, round, and reactive to light.  Neck:     Musculoskeletal: Normal range of motion and neck supple.   Cardiovascular:     Rate and Rhythm: Normal rate and regular rhythm.     Heart sounds: No murmur. No friction rub. No gallop.   Pulmonary:     Effort: Pulmonary effort is normal. No respiratory distress.     Breath sounds: Normal breath sounds. No stridor. No rales.  Abdominal:     General: There is no distension.     Palpations: Abdomen is soft.     Tenderness: There is no abdominal tenderness.  Musculoskeletal:        General: No tenderness.  Skin:    General: Skin is warm and dry.     Findings: No erythema or rash.  Neurological:     Mental Status: She is alert and oriented to person, place, and time.     ED Results and Treatments Labs (all labs ordered are listed, but only abnormal results are displayed) Labs Reviewed  BASIC METABOLIC PANEL - Abnormal; Notable for the following components:      Result Value   Glucose, Bld 103 (*)    All other components within normal limits  CBC  I-STAT BETA HCG BLOOD, ED ([facility], [facility], [facility] ONLY)  TROPONIN I (HIGH SENSITIVITY)  TROPONIN I (HIGH SENSITIVITY)                                                                                                                         EKG  EKG Interpretation  Date/Time:  Thursday June 08 2019 16:08:27 EDT Ventricular Rate:  65 PR Interval:  152 QRS Duration: 76 QT Interval:  450 QTC Calculation: 468 R Axis:  88 Text Interpretation:  Normal sinus rhythm Normal ECG NO STEMI. No old tracing to compare Confirmed by Addison Lank [phone]) on 06/09/2019 12:07:51 AM      Radiology Dg Chest 2 View  Result Date: 06/08/2019 CLINICAL DATA:  Chest pain EXAM: CHEST - 2 VIEW COMPARISON:  04/21/2018 FINDINGS: Normal heart size, mediastinal contours, and pulmonary vascularity. Linear scarring at LEFT base. Mild central peribronchial thickening, chronic. No acute infiltrate, pleural effusion or pneumothorax. Bones unremarkable. IMPRESSION: Minimal bronchitic changes with LEFT basilar scarring. No acute  abnormalities. Electronically Signed   By: Lavonia Dana M.D.   On: 06/08/2019 17:01    Pertinent labs & imaging results that were available during my care of the patient were reviewed by me and considered in my medical decision making (see chart for details).  Medications Ordered in ED Medications  sodium chloride flush (NS) 0.9 % injection 3 mL (has no administration in time range)                                                                                                                                    Procedures Procedures  (including critical care time)  Medical Decision Making / ED Course I have reviewed the nursing notes for this encounter and the patient's prior records (if available in EHR or on provided paperwork).   Mercedes Beltran was evaluated in Emergency Department on 06/09/2019 for the symptoms described in the history of present illness. She was evaluated in the context of the global COVID-19 pandemic, which necessitated consideration that the patient might be at risk for infection with the SARS-CoV-2 virus that causes COVID-19. Institutional protocols and algorithms that pertain to the evaluation of patients at risk for COVID-19 are in a state of rapid change based on information released by regulatory bodies including the CDC and federal and state organizations. These policies and algorithms were followed during the patient's care in the ED.  Atypical chest pain highly inconsistent with ACS.  EKG without acute ischemic changes or evidence of pericarditis.  Serial troponins negative x2.  No need for additional cardiac work-up at this time.  Low suspicion for pulmonary embolism.  Presentation not classic for aortic dissection or esophageal perforation.  Chest x-ray without evidence suggestive of pneumonia, pneumothorax, pneumomediastinum.  No abnormal contour of the mediastinum to suggest dissection. No evidence of acute injuries.  The patient appears reasonably screened  and/or stabilized for discharge and I doubt any other medical condition or other [facility] requiring further screening, evaluation, or treatment in the ED at this time prior to discharge.  The patient is safe for discharge with strict return precautions.       Final Clinical Impression(s) / ED Diagnoses Final diagnoses:  Intermittent chest pain    The patient appears reasonably screened and/or stabilized for discharge and I doubt any other medical condition or other [facility] requiring further screening, evaluation, or treatment in the ED at this  time prior to discharge.  Disposition: Discharge  Condition: Good  I have discussed the results, Dx and Tx plan with the patient who expressed understanding and agree(s) with the plan. Discharge instructions discussed at great length. The patient was given strict return precautions who verbalized understanding of the instructions. No further questions at time of discharge.    ED Discharge Orders    None       Follow Up: [place] [address] [phone]  Schedule an appointment as soon as possible for a visit         This chart was dictated using voice recognition software.  Despite best efforts to proofread,  errors can occur which can change the documentation meaning.   Fatima Blank, MD 06/09/19 0120

## 2019-06-22 NOTE — Progress Notes (Signed)
Cardiology Office Note   Date:  06/29/2019   ID:  Mercedes Beltran, DOB 1966-04-10, MRN [id number]  PCP:  Imagene Riches, NP  Cardiologist:  Dr. Meda Coffee  Chief Complaint  Patient presents with  . Follow-up    History of Present Illness: Mercedes Beltran is a 53 y.o. female who presents for ED follow-up, seen for Dr. Meda Coffee.  Mercedes Beltran has a history of anemia of unknown etiology, untreated hyperlipidemia and family history of premature CAD in her grandfather.  She has a history of former tobacco abuse however quit smoking 8 years ago with significant improvement in shortness of breath.  She was last seen by Dr. Meda Coffee 06/18/2017 after a 1 month history of profound fatigue.  She was under tremendous amount of stress and had been taking an antidepressant which had been started 2 months prior to her visit.  Her TSH was normal.  She had shortness of breath at rest and on exertion with occasional chest pain, both of which were not related to exertion.  She had previously undergone a coronary CT scan which showed a calcium score of 0 and no plaque.  Her pulmonary artery was minimally dilated at 31 mm.  She was previously anemic but received iron infusions with improvement in her hemoglobin.  Echocardiogram performed 06/18/2017 with normal LV function and no regional wall motion abnormalities no valvular disease. Not been seen in our office since this time.  On 06/08/2019 she was seen in the emergency department for intermittent chest pain thought not to be consistent with ACS.  EKG performed without acute ischemic changes or evidence of pericarditis.  Serial troponins were negative x2.  There was no further cardiac work-up at that time.  There was low suspicion for pulmonary embolism.  CXR without evidence suggestive of pneumonia or pneumothorax.  Plans were for her to follow-up with her PCP.  PCP placed the 2-week monitor which showed frequent PACs.   Today, I am seeing Mercedes Beltran in follow-up.  She states  that for the last 2 to 4 weeks she has been in having intermittent chest discomfort, located midsternal without radiation.  Has had some dizziness and SOB associated with this. She works as a Designer, industrial/product.  She reports some exertional qualities however will also happen at rest and at work.  As above, she underwent extensive cardiac evaluation in 2018 with normal coronaries with a calcium score of 0.  She does however have a strong family history fo premature CAD and a history of tobacco use. Given her family history and concerning symptoms, we will plan for further work-up with echocardiogram and either repeat coronary CT versus cath depending on echo.  She was given SL NTG prescription and directed on its use.  Also instructed to start ASA 81 daily.  Beta-blocker sent to pharmacy for frequent PVCs per monitor however patient reluctant to take it. She denies LE swelling, orthopnea or syncope. No cough, hx of PE/DVT, fevers or chills.   Past Medical History:  Diagnosis Date  . Fatty liver   . GERD (gastroesophageal reflux disease)   . Headache   . History of hiatal hernia   . 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 Shortness of breath dyspnea    since 08/2014    Past Surgical History:  Procedure Laterality Date  . ABDOMINAL HYSTERECTOMY    . CHOLECYSTECTOMY      Current Outpatient Medications  Medication Sig Dispense Refill  . aspirin EC 81 MG tablet Take 81 mg  by mouth daily.    . Biotin 1 MG CAPS Take 1 capsule by mouth daily.    . MULTIPLE VITAMIN PO Take 1 tablet by mouth daily.    . RABEprazole (ACIPHEX) 20 MG tablet TAKE 2 CAPSULES BY MOUTH ONCE A DAY BEFORE MEALS    . metoprolol tartrate (LOPRESSOR) 25 MG tablet Take 0.5 tablets (12.5 mg total) by mouth 2 (two) times daily. 180 tablet 3  . nitroGLYCERIN (NITROSTAT) 0.4 MG SL tablet Place 1 tablet (0.4 mg total) under the tongue every 5 (five) minutes as needed for chest pain. 90 tablet 3   No current facility-administered  medications for this visit.     Allergies:   Demerol  [meperidine hcl], Codeine, Demerol [meperidine], and Morphine and related    Social History:  The patient  reports that she quit smoking about 8 years ago. She has never used smokeless tobacco. She reports that she does not drink alcohol.   Family History:  The patient's family history includes Breast cancer in her paternal aunt.    ROS:  Please see the history of present illness. Otherwise, review of systems are positive for none.   All other systems are reviewed and negative.    PHYSICAL EXAM: VS:  BP 126/90   Pulse 86   Ht 5\' 5"  (1.651 m)   Wt 155 lb (70.3 kg)   SpO2 96%   BMI 25.79 kg/m  , BMI Body mass index is 25.79 kg/m.   General: Well developed, well nourished, NAD Skin: Warm, dry, intact  Neck: Negative for carotid bruits. No JVD Lungs:Clear to ausculation bilaterally. No wheezes, rales, or rhonchi. Breathing is unlabored. Cardiovascular: RRR with S1 S2. No murmurs, rubs, gallops, or LV heave appreciated. Extremities: No edema. No clubbing or cyanosis. DP pulses 2+ bilaterally Neuro: Alert and oriented. No focal deficits. No facial asymmetry. MAE spontaneously. Psych: Responds to questions appropriately with normal affect.     EKG:  EKG is not ordered today.  Recent Labs: 06/08/2019: BUN 9; Creatinine, Ser 0.75; Hemoglobin 14.8; Platelets 333; Potassium 3.7; Sodium 137    Lipid Panel No results found for: CHOL, TRIG, HDL, CHOLHDL, VLDL, LDLCALC, LDLDIRECT    Wt Readings from Last 3 Encounters:  06/29/19 155 lb (70.3 kg)  06/25/17 150 lb (68 kg)  06/18/17 152 lb (68.9 kg)    Other studies Reviewed: Additional studies/ records that were reviewed today include:   Echocardiogram 06/18/2017:  Study Conclusions  - Left ventricle: The cavity size was normal. Systolic function was   normal. The estimated ejection fraction was in the range of 60%   to 65%. Wall motion was normal; there were no regional  wall   motion abnormalities. Left ventricular diastolic function   parameters were normal. Doppler parameters are consistent with   indeterminate ventricular filling pressure. - Aortic valve: There was no regurgitation. - Aorta: Ascending aortic diameter: 37 mm (S). - Ascending aorta: The ascending aorta was mildly dilated. - Mitral valve: Transvalvular velocity was within the normal range.   There was no evidence for stenosis. There was no regurgitation. - Right ventricle: The cavity size was normal. Wall thickness was   normal. Systolic function was normal. - Atrial septum: No defect or patent foramen ovale was identified. - Tricuspid valve: There was no regurgitation. - Global longitudinal strain -24.6%.  Coronary CT 05/28/2017:  IMPRESSION: 1. Coronary calcium score of 0. This was 0 percentile for age and sex matched control.  2. Normal coronary origin with right  dominance.  3. No evidence of CAD.  4. Mildly dilated pulmonary artery measuring 31 mm.  ASSESSMENT AND PLAN:  1.  Chest pain with family hx of premature CAD: -Has a known family history of CAD and remote tobacco use -Previously work-up from 2018 with coronary calcium score was 0 normal coronary arteries.  There was no evidence of CAD -Presented to the ED with chest pain which has been occurring for the last 2 to 4 weeks.  Work-up was negative at that time. -Continues to have symptoms of intermittent chest pain, exertional and nonexertional qualities with associated dizziness and mild shortness of breath. -Symptoms slightly concerning for angina -Proceed with echocardiogram to evaluate with the plan for repeat coronary CT versus cardiac cath based on results -We will add ASA 81 daily to regimen -Prescribed metoprolol 12.5 mg twice daily for frequent PVCs per cardiac monitor, patient reluctant to start however instructed that it will be at the pharmacy if she decides to proceed -Given SL NTG with instructions on  use -Currently no anginal pain   Current medicines are reviewed at length with the patient today.  The patient does not have concerns regarding medicines.  The following changes have been made: Add metoprolol 12.5 mg twice daily, add ASA 81 daily, add SL NTG for chest pain with instructions for use reviewed  Labs/ tests ordered today include: Echocardiogram with plan for either coronary CT versus cardiac cath depending on results  Orders Placed This Encounter  Procedures  . ECHOCARDIOGRAM COMPLETE   Disposition:   FU with myself or MD  in 2 weeks  Signed, Kathyrn Drown, NP  06/29/2019 4:49 PM    [facility] [place], [place], [place]  [address] Phone: [phone]; Fax: [phone]

## 2019-06-29 ENCOUNTER — Other Ambulatory Visit: Payer: Self-pay

## 2019-06-29 ENCOUNTER — Ambulatory Visit: Payer: BC Managed Care – PPO | Admitting: Cardiology

## 2019-06-29 ENCOUNTER — Encounter: Payer: Self-pay | Admitting: Cardiology

## 2019-06-29 VITALS — BP 126/90 | HR 86 | Ht 65.0 in | Wt 155.0 lb

## 2019-06-29 DIAGNOSIS — R079 Chest pain, unspecified: Secondary | ICD-10-CM | POA: Diagnosis not present

## 2019-06-29 MED ORDER — NITROGLYCERIN 0.4 MG SL SUBL: 0.4000 mg | SUBLINGUAL_TABLET | SUBLINGUAL | 3 refills | Status: DC | PRN

## 2019-06-29 MED ORDER — METOPROLOL TARTRATE 25 MG PO TABS: 12.5000 mg | ORAL_TABLET | Freq: Two times a day (BID) | ORAL | 3 refills | Status: DC

## 2019-06-29 NOTE — Patient Instructions (Signed)
Medication Instructions:  1.) START: Metoprolol tartrate 12.5 mg twice a day   2.) START: nitroglycerin 0.4 mg as needed for chest pain  3.) START: Aspirin 81 mg once a day   *If you need a refill on your cardiac medications before your next appointment, please call your pharmacy*  Lab Work: None   If you have labs (blood work) drawn today and your tests are completely normal, you will receive your results only by: Marland Kitchen MyChart Message (if you have MyChart) OR . A paper copy in the mail If you have any lab test that is abnormal or we need to change your treatment, we will call you to review the results.  Testing/Procedures: Your physician has requested that you have an echocardiogram. Echocardiography is a painless test that uses sound waves to create images of your heart. It provides your doctor with information about the size and shape of your heart and how well your heart's chambers and valves are working. This procedure takes approximately one hour. There are no restrictions for this procedure.    Follow-Up: You are scheduled for a virtual visit with Kathyrn Drown, NP on 07/12/2019 @ 12:00 PM, Please have your vitals and medications available 10-15 mins prior to appointment   Other Instructions  Nitroglycerin sublingual tablets What is this medicine? NITROGLYCERIN (nye troe GLI ser in) is a type of vasodilator. It relaxes blood vessels, increasing the blood and oxygen supply to your heart. This medicine is used to relieve chest pain caused by angina. It is also used to prevent chest pain before activities like climbing stairs, going outdoors in cold weather, or sexual activity. This medicine may be used for other purposes; ask your health care provider or pharmacist if you have questions. COMMON BRAND NAME(S): Nitroquick, Nitrostat, Nitrotab What should I tell my health care provider before I take this medicine? They need to know if you have any of these  conditions:  anemia  head injury, recent stroke, or bleeding in the brain  liver disease  previous heart attack  an unusual or allergic reaction to nitroglycerin, other medicines, foods, dyes, or preservatives  pregnant or trying to get pregnant  breast-feeding How should I use this medicine? Take this medicine by mouth as needed. At the first sign of an angina attack (chest pain or tightness) place one tablet under your tongue. You can also take this medicine 5 to 10 minutes before an event likely to produce chest pain. Follow the directions on the prescription label. Let the tablet dissolve under the tongue. Do not swallow whole. Replace the dose if you accidentally swallow it. It will help if your mouth is not dry. Saliva around the tablet will help it to dissolve more quickly. Do not eat or drink, smoke or chew tobacco while a tablet is dissolving. If you are not better within 5 minutes after taking ONE dose of nitroglycerin, call 9-1-1 immediately to seek emergency medical care. Do not take more than 3 nitroglycerin tablets over 15 minutes. If you take this medicine often to relieve symptoms of angina, your doctor or health care professional may provide you with different instructions to manage your symptoms. If symptoms do not go away after following these instructions, it is important to call 9-1-1 immediately. Do not take more than 3 nitroglycerin tablets over 15 minutes. Talk to your pediatrician regarding the use of this medicine in children. Special care may be needed. Overdosage: If you think you have taken too much of this medicine contact  a poison control center or emergency room at once. NOTE: This medicine is only for you. Do not share this medicine with others. What if I miss a dose? This does not apply. This medicine is only used as needed. What may interact with this medicine? Do not take this medicine with any of the following medications:  certain migraine medicines  like ergotamine and dihydroergotamine (DHE)  medicines used to treat erectile dysfunction like sildenafil, tadalafil, and vardenafil  riociguat This medicine may also interact with the following medications:  alteplase  aspirin  heparin  medicines for high blood pressure  medicines for mental depression  other medicines used to treat angina  phenothiazines like chlorpromazine, mesoridazine, prochlorperazine, thioridazine This list may not describe all possible interactions. Give your health care provider a list of all the medicines, herbs, non-prescription drugs, or dietary supplements you use. Also tell them if you smoke, drink alcohol, or use illegal drugs. Some items may interact with your medicine. What should I watch for while using this medicine? Tell your doctor or health care professional if you feel your medicine is no longer working. Keep this medicine with you at all times. Sit or lie down when you take your medicine to prevent falling if you feel dizzy or faint after using it. Try to remain calm. This will help you to feel better faster. If you feel dizzy, take several deep breaths and lie down with your feet propped up, or bend forward with your head resting between your knees. You may get drowsy or dizzy. Do not drive, use machinery, or do anything that needs mental alertness until you know how this drug affects you. Do not stand or sit up quickly, especially if you are an older patient. This reduces the risk of dizzy or fainting spells. Alcohol can make you more drowsy and dizzy. Avoid alcoholic drinks. Do not treat yourself for coughs, colds, or pain while you are taking this medicine without asking your doctor or health care professional for advice. Some ingredients may increase your blood pressure. What side effects may I notice from receiving this medicine? Side effects that you should report to your doctor or health care professional as soon as possible:  blurred  vision  dry mouth  skin rash  sweating  the feeling of extreme pressure in the head  unusually weak or tired Side effects that usually do not require medical attention (report to your doctor or health care professional if they continue or are bothersome):  flushing of the face or neck  headache  irregular heartbeat, palpitations  nausea, vomiting This list may not describe all possible side effects. Call your doctor for medical advice about side effects. You may report side effects to FDA at 1-800-FDA-1088. Where should I keep my medicine? Keep out of the reach of children. Store at room temperature between 20 and 25 degrees C (68 and 77 degrees F). Store in Chief of Staff. Protect from light and moisture. Keep tightly closed. Throw away any unused medicine after the expiration date. NOTE: This sheet is a summary. It may not cover all possible information. If you have questions about this medicine, talk to your doctor, pharmacist, or health care provider.  2020 Elsevier/Gold Standard (2013-06-15 17:57:36)

## 2019-06-30 ENCOUNTER — Ambulatory Visit (HOSPITAL_COMMUNITY): Payer: BC Managed Care – PPO | Attending: Cardiology

## 2019-06-30 DIAGNOSIS — R079 Chest pain, unspecified: Secondary | ICD-10-CM | POA: Insufficient documentation

## 2019-07-05 NOTE — Progress Notes (Signed)
Virtual Visit via Video Note   This visit type was conducted due to national recommendations for restrictions regarding the COVID-19 Pandemic (e.g. social distancing) in an effort to limit this patient's exposure and mitigate transmission in our community.  Due to her co-morbid illnesses, this patient is at least at moderate risk for complications without adequate follow up.  This format is felt to be most appropriate for this patient at this time.  All issues noted in this document were discussed and addressed.  A limited physical exam was performed with this format.  Please refer to the patient's chart for her consent to telehealth for [facility].   Date:  07/12/2019   ID:  Mercedes Beltran, DOB 29-Apr-1966, MRN [id number]  Patient Location: Home Provider Location: Home  PCP:  Imagene Riches, NP  Cardiologist:  Dr. Meda Coffee, MD Electrophysiologist:  None   Evaluation Performed:  Follow-Up Visit  Chief Complaint: Follow-up cardiac testing, seen for Dr. Meda Coffee  History of Present Illness:    Mercedes Beltran is a 53 y.o. female with a history of anemia of unknown etiology, untreated hyperlipidemia and family history of premature CAD in her grandfather.  She has a history of former tobacco abuse however quit smoking 8 years ago with significant improvement in shortness of breath.  She was last seen by Dr. Meda Coffee 06/18/2017 after a 1 month history of profound fatigue.  She was under tremendous amount of stress and had been taking an antidepressant which had been started 2 months prior to her visit.  Her TSH was normal.  She had shortness of breath at rest and on exertion with occasional chest pain, both of which were not related to exertion.  She had previously undergone a coronary CT scan which showed a calcium score of 0 and no plaque.  Her pulmonary artery was minimally dilated at 31 mm.  She was previously anemic but received iron infusions with improvement in her hemoglobin.  Echocardiogram  performed 06/18/2017 with normal LV function and no regional wall motion abnormalities no valvular disease. Not been seen in our office since this time.  On 06/08/2019 she was seen in the emergency department for intermittent chest pain thought not to be consistent with ACS.  EKG performed without acute ischemic changes or evidence of pericarditis.  Serial troponins were negative x2. There was no further cardiac work-up at that time. There was low suspicion for pulmonary embolism. CXR without evidence suggestive of pneumonia or pneumothorax.  Plans were for her to follow-up with her PCP.  PCP placed the 2-week monitor which showed frequent PACs.   She was last seen by myself on 06/29/19 when she reported having intermittent chest pain for the previous 2 to 4 weeks. Had some dizziness and SOB associated with this. She works as a Designer, industrial/product. She reported some exertional qualities however would also happen at rest and at work.  As above, she underwent extensive cardiac evaluation in 2018 with normal coronaries with a calcium score of 0. Given her family history and concerning symptoms, plan was for or echocardiogram and possibly repeat coronary CT versus cath depending on echo.  She was given SL NTG prescription and directed on its use. Also instructed to start ASA 81 daily. Beta-blocker sent to pharmacy for frequent PVCs per monitor however patient reluctant to take it.   Echo found to be normal however did have elevated right atrial pressures. Results sent to Dr. Meda Coffee who recommended follow up PFTs if not already. It appears these  were performed in 2018 and found to be normal   Her chest pain symptoms are unclear to me. Likely not cardiac etiology. No follow up CTA was recommended per Dr. Meda Coffee given no evidence of CAD just 2 years prior.   Today's that her symptoms are less frequent.  Has had only 2 fleeting chest pain episodes.  She has a prescription for sublingual nitroglycerin however did  not have it with her at that time.  Symptoms did not last long in duration and she had no associated symptoms.  Reports much improvement from when we last spoke.  States that her shortness of breath is also improved. As above, we reviewed her echocardiogram and the fact that I am reassured that this was normal with no wall motion abnormalities.  Also reassured given completely normal coronary CT scan in 2018. We discussed that she should continue to monitor her symptoms and if these become more frequent or intense she is to let us know or go to the ED for further evaluation.  She did pick up her metoprolol however has not taken this medication.  Reports her palpitations have also improved.  I suspect that much of her symptoms are related to anxiety and stress in which she agrees.  Discussed possible need for PFTs if shortness of breath returns.  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  Denies LE swelling, orthopnea, PND, dizziness, presyncopal or syncopal episodes.  Is asking for a work note to return to work.  I feel comfortable that she may return now that her symptoms have improved.   The patient does not have symptoms concerning for COVID-19 infection (fever, chills, cough, or new shortness of breath).    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   Biotin 1 MG CAPS Take 1 capsule by mouth daily.   metoprolol tartrate (LOPRESSOR) 25 MG tablet Take 0.5 tablets (12.5 mg total) by mouth 2 (two) times daily.   MULTIPLE VITAMIN PO Take 1 tablet by mouth daily.   nitroGLYCERIN  (NITROSTAT) 0.4 MG SL tablet Place 1 tablet (0.4 mg total) under the tongue every 5 (five) minutes as needed for chest pain.   RABEprazole (ACIPHEX) 20 MG tablet TAKE 2 CAPSULES BY MOUTH ONCE A DAY BEFORE MEALS     Allergies:   Demerol  [meperidine hcl], Codeine, Demerol [meperidine], and Morphine and related   Social History   Tobacco Use   Smoking status: Former Smoker    Quit date: 02/20/2011    Years since quitting: 8.3   Smokeless tobacco: Never Used   Tobacco comment: quit 01/2011  Substance Use Topics   Alcohol use: No    Alcohol/week: 0.0 standard drinks   Drug use: Not on file     Family Hx: The patient's family history includes Breast cancer in her paternal aunt.  ROS:   Please see the history of present illness.     All other systems reviewed and are negative.  Prior CV studies:   The  following studies were reviewed today:  Echocardiogram 06/18/2017:  Study Conclusions  - Left ventricle: The cavity size was normal. Systolic function was normal. The estimated ejection fraction was in the range of 60% to 65%. Wall motion was normal; there were no regional wall motion abnormalities. Left ventricular diastolic function parameters were normal. Doppler parameters are consistent with indeterminate ventricular filling pressure. - Aortic valve: There was no regurgitation. - Aorta: Ascending aortic diameter: 37 mm (S). - Ascending aorta: The ascending aorta was mildly dilated. - Mitral valve: Transvalvular velocity was within the normal range. There was no evidence for stenosis. There was no regurgitation. - Right ventricle: The cavity size was normal. Wall thickness was normal. Systolic function was normal. - Atrial septum: No defect or patent foramen ovale was identified. - Tricuspid valve: There was no regurgitation. - Global longitudinal strain -24.6%.  Coronary CT 05/28/2017:  IMPRESSION: 1. Coronary calcium score of 0. This was 0  percentile for age and sex matched control.  2. Normal coronary origin with right dominance.  3. No evidence of CAD.  4. Mildly dilated pulmonary artery measuring 31 mm.  Labs/Other Tests and Data Reviewed:    EKG:  No ECG reviewed.  Recent Labs: 06/08/2019: BUN 9; Creatinine, Ser 0.75; Hemoglobin 14.8; Platelets 333; Potassium 3.7; Sodium 137   Recent Lipid Panel No results found for: CHOL, TRIG, HDL, CHOLHDL, LDLCALC, LDLDIRECT  Wt Readings from Last 3 Encounters:  07/12/19 159 lb (72.1 kg)  06/29/19 155 lb (70.3 kg)  06/25/17 150 lb (68 kg)     Objective:    Vital Signs:  BP 115/80    Pulse 74    Ht 5\' 5"  (1.651 m)    Wt 159 lb (72.1 kg)    BMI 26.46 kg/m    VITAL SIGNS:  reviewed GEN:  no acute distress NEURO:  alert and oriented x 3, no obvious focal deficit PSYCH:  normal affect  ASSESSMENT & PLAN:    1. Chest pain with family hx of premature CAD: -Has a known family history of CAD and remote tobacco use -Previously work-up from 2018 with coronary calcium score was 0 normal coronary arteries.  There was no evidence of CAD -Presented to the ED with chest pain which has been occurring for the last 2 to 4 weeks.  Work-up was negative at that time. -Reports fleeting 2 episodes of chest pain since last seen which resolved on their own without associated symptoms.  No exertional qualities.  Low suspicion for cardiac etiology.  -Echocardiogram was found to be normal with no wall motion abnormalities and normal EF -Continue ASA 81 daily  -Prescribed metoprolol 12.5 mg twice daily for frequent PVCs per cardiac monitor, patient reluctant to start however instructed that it will be at the pharmacy if she decides to proceed -Given SL NTG with instructions on use  2.  Palpitations: -Denies recurrent symptoms despite not taking metoprolol -Likely stress/anxiety induced -Low suspicion for cardiac etiology given negative work-up  3.  Shortness of  breath: -Resolved -Likely in the setting of remote history of tobacco use given normal echocardiogram   COVID-19 Education: The signs and symptoms of COVID-19 were discussed with the patient and how to seek care for testing (follow up with PCP or arrange E-visit).  The importance of social distancing was discussed today.  Time:   Today, I have spent 20 minutes with the patient with telehealth technology discussing the above problems.     Medication Adjustments/Labs and Tests Ordered: Current medicines are  reviewed at length with the patient today.  Concerns regarding medicines are outlined above.   Tests Ordered: No orders of the defined types were placed in this encounter.   Medication Changes: No orders of the defined types were placed in this encounter.   Follow Up:  Either In Person or Virtual Dr. Meda Coffee in 2 to 3 months  Signed, Kathyrn Drown, NP  07/12/2019 12:28 PM    [place]

## 2019-07-12 ENCOUNTER — Telehealth (INDEPENDENT_AMBULATORY_CARE_PROVIDER_SITE_OTHER): Payer: BC Managed Care – PPO | Admitting: Cardiology

## 2019-07-12 ENCOUNTER — Other Ambulatory Visit: Payer: Self-pay

## 2019-07-12 ENCOUNTER — Encounter: Payer: Self-pay | Admitting: Cardiology

## 2019-07-12 VITALS — BP 115/80 | HR 74 | Ht 65.0 in | Wt 159.0 lb

## 2019-07-12 DIAGNOSIS — R079 Chest pain, unspecified: Secondary | ICD-10-CM | POA: Diagnosis not present

## 2019-07-12 DIAGNOSIS — Z8249 Family history of ischemic heart disease and other diseases of the circulatory system: Secondary | ICD-10-CM

## 2019-07-12 DIAGNOSIS — R002 Palpitations: Secondary | ICD-10-CM

## 2019-07-12 DIAGNOSIS — R001 Bradycardia, unspecified: Secondary | ICD-10-CM

## 2019-07-12 NOTE — Patient Instructions (Signed)
Medication Instructions:   Your physician recommends that you continue on your current medications as directed. Please refer to the Current Medication list given to you today.  *If you need a refill on your cardiac medications before your next appointment, please call your pharmacy*  Lab Work:  None ordered today  Testing/Procedures:  None ordered today  Follow-Up: At [facility], you and your health needs are our priority.  As part of our continuing mission to provide you with exceptional heart care, we have created designated Provider Care Teams.  These Care Teams include your primary Cardiologist (physician) and Advanced Practice Providers (APPs -  Physician Assistants and Nurse Practitioners) who all work together to provide you with the care you need, when you need it.  Your next appointment:   10/26/2019 with Ena Dawley, MD at 8:00AM  The format for your next appointment:   In Person

## 2019-07-14 NOTE — Telephone Encounter (Signed)
Per Kathyrn Drown, place order for 2 week Zio monitor to evaluate for bradycardia. Not currently taking prescribed BB.   Sent mychart message to patient for address and insurance information, so we can get monitored sent to patient as soon as we can.

## 2019-07-24 ENCOUNTER — Telehealth: Payer: Self-pay

## 2019-07-24 NOTE — Telephone Encounter (Signed)
14 day ZIO ordered and mailed to pt.  

## 2019-07-25 ENCOUNTER — Other Ambulatory Visit: Payer: Self-pay | Admitting: Cardiology

## 2019-08-04 ENCOUNTER — Other Ambulatory Visit (INDEPENDENT_AMBULATORY_CARE_PROVIDER_SITE_OTHER): Payer: BC Managed Care – PPO

## 2019-08-04 DIAGNOSIS — R001 Bradycardia, unspecified: Secondary | ICD-10-CM | POA: Diagnosis not present

## 2019-08-31 ENCOUNTER — Telehealth: Payer: Self-pay | Admitting: Cardiology

## 2019-08-31 NOTE — Telephone Encounter (Signed)
Spoke with pt, advised results not yet available.

## 2019-08-31 NOTE — Telephone Encounter (Signed)
Follow Up: ° ° °Pt wants to know if her Monitor result is ready please? °

## 2019-09-05 NOTE — Telephone Encounter (Signed)
Follow Up  Patient states that she missed a call, possibly about results. Please give patient a call back.

## 2019-09-05 NOTE — Telephone Encounter (Signed)
I did not call patient, are monitor results in?

## 2019-09-05 NOTE — Telephone Encounter (Signed)
I called and spoke with patient, according to mychart her monitor results are back. I do see them in Epic. I don't know if you or Dr. Meda Coffee will read them. Advised patient I would call back.

## 2019-09-06 NOTE — Telephone Encounter (Signed)
Please let her know that there was evidence of slow heart rate and some faster heart rates. There was 5 episodes of very short SVT. No evidence of AF and no correlation with arrhythmias per review note. She should continue the beta blocker that we started and keep followup with Dr. Meda Coffee 10/26/19  Thank you Sharee Pimple

## 2019-09-06 NOTE — Telephone Encounter (Signed)
I called and spoke with patient, she states that she is still having intermittent chest pain. The chest pain can come and go at any time, with rest or if patient is up moving. She states that she has taken Nitroglycerin for the pain and it did help. She did state that she was diagnosed with mild enlargement of her heart. She wants to know if she needs to have this checked again? Does she need to be seen again for chest pain?

## 2019-09-07 NOTE — Telephone Encounter (Signed)
I called patient, there was no answer and voicemail was full.

## 2019-09-12 NOTE — Telephone Encounter (Signed)
I tried to call patient, there was no answer and voicemail was full.

## 2019-09-14 ENCOUNTER — Telehealth: Payer: Self-pay

## 2019-09-14 ENCOUNTER — Encounter: Payer: Self-pay | Admitting: Cardiology

## 2019-09-14 DIAGNOSIS — R079 Chest pain, unspecified: Secondary | ICD-10-CM

## 2019-09-14 NOTE — Telephone Encounter (Signed)
Patient is agreeable to setting up coronary CTA for chest pain. Orders in and signed.

## 2019-10-17 ENCOUNTER — Telehealth (HOSPITAL_COMMUNITY): Payer: Self-pay | Admitting: Emergency Medicine

## 2019-10-17 ENCOUNTER — Encounter (HOSPITAL_COMMUNITY): Payer: Self-pay

## 2019-10-17 NOTE — Telephone Encounter (Signed)
Left message on voicemail with name and callback number Breezy Hertenstein RN Navigator Cardiac Imaging [facility] [phone] Office [phone] Cell  

## 2019-10-19 ENCOUNTER — Ambulatory Visit (HOSPITAL_COMMUNITY)
Admission: RE | Admit: 2019-10-19 | Discharge: 2019-10-19 | Disposition: A | Discharge status: Home / Self Care | Payer: BC Managed Care – PPO | Source: Ambulatory Visit | Attending: Cardiology | Admitting: Cardiology

## 2019-10-19 ENCOUNTER — Other Ambulatory Visit: Payer: Self-pay

## 2019-10-19 DIAGNOSIS — R079 Chest pain, unspecified: Secondary | ICD-10-CM | POA: Insufficient documentation

## 2019-10-19 MED ORDER — IOHEXOL 350 MG/ML SOLN
80.0000 mL | Freq: Once | INTRAVENOUS | Status: AC | PRN
  Administered 2019-10-19: 80 mL via INTRAVENOUS

## 2019-10-19 MED ORDER — NITROGLYCERIN 0.4 MG SL SUBL: 0.8000 mg | SUBLINGUAL_TABLET | Freq: Once | SUBLINGUAL | Status: DC

## 2019-10-19 MED ORDER — NITROGLYCERIN 0.4 MG SL SUBL
SUBLINGUAL_TABLET | SUBLINGUAL | Status: AC
  Filled 2019-10-19: qty 2

## 2019-10-23 ENCOUNTER — Other Ambulatory Visit: Payer: Self-pay

## 2019-10-23 ENCOUNTER — Ambulatory Visit: Payer: BC Managed Care – PPO | Admitting: Cardiology

## 2019-10-23 ENCOUNTER — Encounter: Payer: Self-pay | Admitting: Cardiology

## 2019-10-23 VITALS — BP 118/76 | HR 60 | Ht 65.0 in | Wt 165.4 lb

## 2019-10-23 DIAGNOSIS — Z8249 Family history of ischemic heart disease and other diseases of the circulatory system: Secondary | ICD-10-CM

## 2019-10-23 DIAGNOSIS — R002 Palpitations: Secondary | ICD-10-CM

## 2019-10-23 DIAGNOSIS — R001 Bradycardia, unspecified: Secondary | ICD-10-CM | POA: Diagnosis not present

## 2019-10-23 DIAGNOSIS — R079 Chest pain, unspecified: Secondary | ICD-10-CM

## 2019-10-23 MED ORDER — FLECAINIDE ACETATE 50 MG PO TABS: 50.0000 mg | ORAL_TABLET | Freq: Every day | ORAL | 3 refills | Status: DC

## 2019-10-23 NOTE — Progress Notes (Addendum)
Cardiology Office Note   Date:  10/23/2019   ID:  Mercedes Beltran 08-29-1966, MRN [id number]  PCP:  Mercedes Riches, NP  Cardiologist:  Dr. Meda Coffee  No chief complaint on file.   History of Present Illness: Mercedes Beltran is a 54 y.o. female with history of hyperlipidemia and family history of premature CAD in her grandfather.  She has a history of former tobacco abuse however quit smoking 8 years ago with significant improvement in shortness of breath. She was previously seen in 2018 for fatigue and chest pain, She had undergone a coronary CT scan which showed a calcium score of 0 and no plaque.  Her pulmonary artery was minimally dilated at 31 mm. Echocardiogram performed 06/18/2017 with normal LV function and no regional wall motion abnormalities no valvular disease.  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  She was seen in October 2020 for recurrent episodes of chest pain and palpitations.  A cardiac monitor was ordered.  A repeat coronary CTA was ordered as well.  Today she states that her chest pain have resolved, she denies any other new symptoms.   Past Medical History:  Diagnosis Date  . Fatty liver   . GERD (gastroesophageal reflux disease)   . Headache   . History of hiatal hernia   . 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 Shortness of breath dyspnea    since 08/2014    Past Surgical History:  Procedure Laterality Date  . ABDOMINAL HYSTERECTOMY    . CHOLECYSTECTOMY      Current Outpatient Medications  Medication Sig Dispense Refill  . aspirin EC 81 MG tablet Take 81 mg by mouth daily.    . Biotin 1 MG CAPS Take 1 capsule by mouth daily.    . metoprolol tartrate (LOPRESSOR) 25 MG tablet Take 0.5 tablets (12.5 mg total) by mouth 2 (two) times daily. 180 tablet 3  . MULTIPLE VITAMIN PO Take 1 tablet by mouth daily.    . nitroGLYCERIN (NITROSTAT) 0.4 MG SL tablet PLACE 1 TABLET (0.4 MG TOTAL) UNDER THE TONGUE EVERY 5 (FIVE)  MINUTES AS NEEDED FOR CHEST PAIN. 25 tablet 3  . RABEprazole (ACIPHEX) 20 MG tablet TAKE 2 CAPSULES BY MOUTH ONCE A DAY BEFORE MEALS     No current facility-administered medications for this visit.    Allergies:   Demerol  [meperidine hcl], Codeine, Demerol [meperidine], and Morphine and related    Social History:  The patient  reports that she quit smoking about 8 years ago. She has never used smokeless tobacco. She reports that she does not drink alcohol.   Family History:  The patient's family history includes Breast cancer in her paternal aunt.    ROS:  Please see the history of present illness. Otherwise, review of systems are positive for none.   All other systems are reviewed and negative.    PHYSICAL EXAM: VS:  BP 118/76   Pulse 60   Ht 5\' 5"  (1.651 m)   Wt 165 lb 6.4 oz (75 kg)   SpO2 96%   BMI 27.52 kg/m  , BMI Body mass index is 27.52 kg/m.   General: Well developed, well nourished, NAD Skin: Warm, dry, intact  Neck: Negative for carotid bruits. No JVD Lungs:Clear to ausculation bilaterally. No wheezes, rales, or rhonchi. Breathing is unlabored. Cardiovascular: RRR with S1 S2. No murmurs, rubs, gallops, or LV heave appreciated. Extremities: No edema. No clubbing or cyanosis. DP pulses  2+ bilaterally Neuro: Alert and oriented. No focal deficits. No facial asymmetry. MAE spontaneously. Psych: Responds to questions appropriately with normal affect.     EKG:  EKG is ordered today.  It shows sinus bradycardia, otherwise normal EKG unchanged from prior.  Recent Labs: 06/08/2019: BUN 9; Creatinine, Ser 0.75; Hemoglobin 14.8; Platelets 333; Potassium 3.7; Sodium 137    Lipid Panel No results found for: CHOL, TRIG, HDL, CHOLHDL, VLDL, LDLCALC, LDLDIRECT    Wt Readings from Last 3 Encounters:  10/23/19 165 lb 6.4 oz (75 kg)  07/12/19 159 lb (72.1 kg)  06/29/19 155 lb (70.3 kg)    Other studies Reviewed: Additional studies/ records that were reviewed today include:    Echocardiogram 06/18/2017:  Study Conclusions  - Left ventricle: The cavity size was normal. Systolic function was   normal. The estimated ejection fraction was in the range of 60%   to 65%. Wall motion was normal; there were no regional wall   motion abnormalities. Left ventricular diastolic function   parameters were normal. Doppler parameters are consistent with   indeterminate ventricular filling pressure. - Aortic valve: There was no regurgitation. - Aorta: Ascending aortic diameter: 37 mm (S). - Ascending aorta: The ascending aorta was mildly dilated. - Mitral valve: Transvalvular velocity was within the normal range.   There was no evidence for stenosis. There was no regurgitation. - Right ventricle: The cavity size was normal. Wall thickness was   normal. Systolic function was normal. - Atrial septum: No defect or patent foramen ovale was identified. - Tricuspid valve: There was no regurgitation. - Global longitudinal strain -24.6%.  Coronary CT 05/28/2017:  IMPRESSION: 1. Coronary calcium score of 0. This was 0 percentile for age and sex matched control. 2. Normal coronary origin with right dominance. 3. No evidence of CAD. 4. Mildly dilated pulmonary artery measuring 31 mm.   CCTA: 10/2019   1. Coronary calcium score of 0. This was 0 percentile for age and sex matched control.  2. Normal coronary origin with right dominance.  3. CAD-RADS 0. No evidence of CAD (0%). Consider non-atherosclerotic causes of chest pain.   ASSESSMENT AND PLAN:   1. Chest pain with family hx of premature CAD: -Repeat coronary CTA showed no evidence for coronary artery disease, calcium score of 0 and CAD RADS 0.  2.  Palpitations: -Her cardiac monitor showed sinus bradycardia in 40s to sinus tachycardia. Five episodes of very short SVTs, the longest lasting 18 beats. -We will add flecainide 50 mg p.o. twice daily to low-dose metoprolol 12.5 mg p.o. twice daily, there is no  room to increase beta-blocker as her heart rate goes down to 40s, we will arrange for exercise treadmill stress test 5 days from initiation.  3.  Shortness of breath: -Resolved -Likely in the setting of remote history of tobacco use given normal echocardiogram  Current medicines are reviewed at length with the patient today.  The patient does not have concerns regarding medicines.  The following changes have been made: Add metoprolol 12.5 mg twice daily, add ASA 81 daily, add SL NTG for chest pain with instructions for use reviewed  Labs/ tests ordered today include: Echocardiogram with plan for either coronary CT versus cardiac cath depending on results  No orders of the defined types were placed in this encounter.  Disposition:   FU with myself or MD  in 2 weeks  Signed, Ena Dawley, MD  10/23/2019 8:36 AM    [facility]  [address] [place], [place]  [address] Phone: [phone]; Fax: [phone]

## 2019-10-23 NOTE — Patient Instructions (Signed)
Medication Instructions:  Your physician has recommended you make the following change in your medication:  1-START Flecainide 50 mg daily  *If you need a refill on your cardiac medications before your next appointment, please call your pharmacy*  Lab Work: If you have labs (blood work) drawn today and your tests are completely normal, you will receive your results only by: Marland Kitchen My Chart Message (if you have My Chart) OR . A paper copy in the mail If you have any lab test that is abnormal or we need to change your treatment, we will call you to review the results.  Testing/Procedures: Your physician has requested that you have an exercise tolerance test. For further information please visit [email or web address]. Please also follow instruction sheet, as given.  Follow-Up: At [facility], you and your health needs are our priority.  As part of our continuing mission to provide you with exceptional heart care, we have created designated Provider Care Teams.  These Care Teams include your primary Cardiologist (physician) and Advanced Practice Providers (APP's -  Physician Assistants and Nurse Practitioners) who all work together to provide you with the care you need, when you need it.  Your next appointment:   2 month(s)  The format for your next appointment:   In Person  Provider:   You may see Dr. Meda Coffee or one of the following Advanced Practice Providers on your designated Care Team:    Day na Idolina Primer, [place]  Areatha Keas ze, PA-C  Other Instructions You have been referred to pulmonary.

## 2019-10-25 ENCOUNTER — Ambulatory Visit: Payer: BC Managed Care – PPO | Admitting: Cardiology

## 2019-10-26 ENCOUNTER — Ambulatory Visit: Payer: BC Managed Care – PPO | Admitting: Cardiology

## 2019-10-31 ENCOUNTER — Encounter (HOSPITAL_COMMUNITY): Payer: BC Managed Care – PPO

## 2019-11-02 ENCOUNTER — Telehealth (HOSPITAL_COMMUNITY): Payer: Self-pay

## 2019-11-02 NOTE — Telephone Encounter (Signed)
Encounter complete. 

## 2019-11-03 ENCOUNTER — Other Ambulatory Visit (HOSPITAL_COMMUNITY): Payer: BC Managed Care – PPO

## 2019-11-06 ENCOUNTER — Ambulatory Visit: Payer: BC Managed Care – PPO | Admitting: Pulmonary Disease

## 2019-11-06 ENCOUNTER — Encounter: Payer: Self-pay | Admitting: Pulmonary Disease

## 2019-11-06 ENCOUNTER — Other Ambulatory Visit: Payer: Self-pay

## 2019-11-06 DIAGNOSIS — R0609 Other forms of dyspnea: Secondary | ICD-10-CM

## 2019-11-06 DIAGNOSIS — R0600 Dyspnea, unspecified: Secondary | ICD-10-CM

## 2019-11-06 DIAGNOSIS — J449 Chronic obstructive pulmonary disease, unspecified: Secondary | ICD-10-CM

## 2019-11-06 MED ORDER — ALBUTEROL SULFATE HFA 108 (90 BASE) MCG/ACT IN AERS: 2.0000 | INHALATION_SPRAY | Freq: Four times a day (QID) | RESPIRATORY_TRACT | 1 refills | Status: DC | PRN

## 2019-11-06 NOTE — Assessment & Plan Note (Addendum)
Trial of albuterol MDI 2 puffs every 6 hours as needed for wheezing or shortness of breath -call us back to let us know if this helps  Doubt that she needs LABA/LAMA  If persistent symptoms, then will repeat PFTs

## 2019-11-06 NOTE — Progress Notes (Signed)
Subjective:    Patient ID: Mercedes Beltran, female    DOB: [birth year], 54 y.o.   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 She has undergone detailed cardiac evaluation for chest pain/shortness of breath and palpitations that have been ongoing for 6 to 7 months.  Coronary CT scan showed very low calcium score and exercise stress testing is planned due to persistent symptoms. She reports dyspnea that occurs even at rest but mostly on exertion and is relieved by rest  Cardiac monitor showed- 5 episodes of very short SVTs, flecainide was added to low-dose metoprolol, but she is still having persistent symptoms Exercise stress test scheduled for 11/07/2019  I have reviewed all these tests and cardiac consultations. She reports symptoms of esophageal reflux almost daily, she underwent esophageal dilatation in 2012 and now follows with Dr. Paulita Fujita.  I reviewed esophagram from 2018 and 2020 which shows mild stricture  She smoked for about 25 pack years up to a pack per day before she quit in 2012.  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 Mild stricture above the hiatal hernia which did not impede passage of the barium tablet  Coronary CT 05/2017 calcium score of 0 Repeat coronary CT 2/21 calcium score of 0, normal coronaries   Past Medical  History:  Diagnosis Date  . Fatty liver   . GERD (gastroesophageal reflux disease)   . Headache   . History of hiatal hernia   . 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 Shortness of breath dyspnea    since 08/2014    Past Surgical History:  Procedure Laterality Date  . ABDOMINAL HYSTERECTOMY    . CHOLECYSTECTOMY     Allergies  Allergen Reactions  . Demerol  [Meperidine Hcl] Nausea And Vomiting  . Codeine Nausea And Vomiting  . Demerol [Meperidine] Nausea And Vomiting  . Morphine And Related Nausea And Vomiting    Social History   Socioeconomic History  . Marital status: Married    Spouse name: Not on file  . Number of children: Not on file  . Years of education: Not on file  . Highest education level: Not on file  Occupational History  . Not on file  Tobacco Use  . Smoking status: Former Smoker    Quit date: 02/20/2011    Years since quitting: 8.7  . Smokeless tobacco: Never Used  . Tobacco comment: quit 01/2011  Substance and Sexual Activity  . Alcohol use: No    Alcohol/week: 0.0 standard drinks  . Drug use: Not on file  . Sexual activity: Yes  Other Topics Concern  . Not on file  Social History Narrative  . Not on file   Social Determinants of Health   Financial Resource Strain:   . Difficulty of Paying Living Expenses: Not on file  Food Insecurity:   . Worried About [employer] of  Food in the Last Year: Not on file  . Ran Out of Food in the Last Year: Not on file  Transportation Needs:   . Lack of Transportation (Medical): Not on file  . Lack of Transportation (Non-Medical): Not on file  Physical Activity:   . Days of Exercise per Week: Not on file  . Minutes of Exercise per Session: Not on file  Stress:   . Feeling of Stress : Not on file  Social Connections:   . Frequency of Communication with Friends and Family: Not on file  . Frequency of Social Gatherings with Friends and Family: Not on file  . Attends Religious Services: Not on file  . Active  Member of Clubs or Organizations: Not on file  . Attends Archivist Meetings: Not on file  . Marital Status: Not on file  Intimate Partner Violence:   . Fear of Current or Ex-Partner: Not on file  . Emotionally Abused: Not on file  . Physically Abused: Not on file  . Sexually Abused: Not on file     Family History  Problem Relation Age of Onset  . Breast cancer Paternal Aunt        x6, not sure of ages of onset     Review of Systems Constitutional: negative for anorexia, fevers and sweats  Eyes: negative for irritation, redness and visual disturbance  Ears, nose, mouth, throat, and face: negative for earaches, epistaxis, nasal congestion and sore throat  Respiratory: negative for cough, dyspnea on exertion, sputum and wheezing  Cardiovascular: negative for chest pain, dyspnea, lower extremity edema, orthopnea, palpitations and syncope  Gastrointestinal: negative for abdominal pain, constipation, diarrhea, melena, nausea and vomiting  Genitourinary:negative for dysuria, frequency and hematuria  Hematologic/lymphatic: negative for bleeding, easy bruising and lymphadenopathy  Musculoskeletal:negative for arthralgias, muscle weakness and stiff joints  Neurological: negative for coordination problems, gait problems, headaches and weakness  Endocrine: negative for diabetic symptoms including polydipsia, polyuria and weight loss     Objective:   Physical Exam  Gen. Pleasant, well-nourished, in no distress, normal affect ENT - no pallor,icterus, no post nasal drip Neck: No JVD, no thyromegaly, no carotid bruits Lungs: no use of accessory muscles, no dullness to percussion, clear without rales or rhonchi  Cardiovascular: Rhythm regular, heart sounds  normal, no murmurs or gallops, no peripheral edema Abdomen: soft and non-tender, no hepatosplenomegaly, BS normal. Musculoskeletal: No deformities, no cyanosis or clubbing Neuro:  alert, non focal       Assessment & Plan:

## 2019-11-06 NOTE — Assessment & Plan Note (Signed)
mild COPD from smoking in the past -I doubt that this is the main issue No other cause of her dyspnea is identified-she does not seem to have ILD on imaging, pulmonary hypertension is very mild with RVSP of 31  Await results of cardiac stress testing.  If heart and lungs check  out fine, then would discuss with Dr. Paulita Fujita about esophageal narrowing-and whether this could be causing some of your symptoms

## 2019-11-06 NOTE — Patient Instructions (Signed)
  You have mild COPD from smoking in the past. Trial of albuterol MDI 2 puffs every 6 hours as needed for wheezing or shortness of breath -call us back to let us know if this helps you.  Await results of cardiac testing.  If heart and lungs check  out fine, then would discuss with Dr. Paulita Fujita about esophageal narrowing-and whether this could be causing some of your symptoms

## 2019-11-07 ENCOUNTER — Encounter (HOSPITAL_COMMUNITY): Payer: Self-pay

## 2019-11-07 ENCOUNTER — Ambulatory Visit (HOSPITAL_COMMUNITY)
Admission: RE | Admit: 2019-11-07 | Discharge: 2019-11-07 | Disposition: A | Discharge status: Home / Self Care | Payer: BC Managed Care – PPO | Source: Ambulatory Visit | Attending: Internal Medicine | Admitting: Internal Medicine

## 2019-11-07 DIAGNOSIS — R079 Chest pain, unspecified: Secondary | ICD-10-CM

## 2019-11-16 ENCOUNTER — Telehealth (HOSPITAL_COMMUNITY): Payer: Self-pay

## 2019-11-16 NOTE — Telephone Encounter (Signed)
Encounter complete. 

## 2019-11-20 ENCOUNTER — Telehealth (HOSPITAL_COMMUNITY): Payer: Self-pay | Admitting: Cardiology

## 2019-11-20 NOTE — Telephone Encounter (Signed)
Patient returned my call and had now sent results thru [facility] and will also bring paper copy with her tomorrow.

## 2019-11-20 NOTE — Telephone Encounter (Signed)
I called patient regarding still not having COVID test results for [facility] at the [facility] office on 11/21/2019.  I had to leave a voicemail. Will document after patient returns call.

## 2019-11-21 ENCOUNTER — Ambulatory Visit (HOSPITAL_COMMUNITY)
Admission: RE | Admit: 2019-11-21 | Discharge: 2019-11-21 | Disposition: A | Discharge status: Home / Self Care | Payer: BC Managed Care – PPO | Source: Ambulatory Visit | Attending: Cardiovascular Disease | Admitting: Cardiovascular Disease

## 2019-11-21 ENCOUNTER — Other Ambulatory Visit: Payer: Self-pay

## 2019-11-21 DIAGNOSIS — R079 Chest pain, unspecified: Secondary | ICD-10-CM | POA: Diagnosis not present

## 2019-11-21 LAB — EXERCISE TOLERANCE TEST
Estimated workload: 10.1 METS
Exercise duration (min): 9 min
Exercise duration (sec): 0 s
MPHR: 166 {beats}/min
Peak HR: 151 {beats}/min
Percent HR: 90 %
RPE: 17
Rest HR: 52 {beats}/min

## 2020-01-05 ENCOUNTER — Ambulatory Visit: Payer: BC Managed Care – PPO | Admitting: Cardiology

## 2020-01-26 ENCOUNTER — Other Ambulatory Visit: Payer: Self-pay | Admitting: Family

## 2020-01-26 DIAGNOSIS — Z1231 Encounter for screening mammogram for malignant neoplasm of breast: Secondary | ICD-10-CM

## 2020-02-07 ENCOUNTER — Other Ambulatory Visit: Payer: Self-pay

## 2020-02-07 ENCOUNTER — Ambulatory Visit
Admission: RE | Admit: 2020-02-07 | Discharge: 2020-02-07 | Disposition: A | Discharge status: Home / Self Care | Payer: BC Managed Care – PPO | Source: Ambulatory Visit | Attending: Family | Admitting: Family

## 2020-02-07 DIAGNOSIS — Z1231 Encounter for screening mammogram for malignant neoplasm of breast: Secondary | ICD-10-CM

## 2020-02-16 ENCOUNTER — Telehealth: Payer: Self-pay | Admitting: Cardiology

## 2020-02-16 NOTE — Telephone Encounter (Signed)
New Message:    Pt said she saw her primary doctor on 01-30-20 and her heart rate was 45. Doctor was supposed to send over notes to Dr Meda Coffee. She wanted pt to see Dr Meda Coffee or an APP.

## 2020-02-16 NOTE — Telephone Encounter (Signed)
Follow Up:  She is at work, please have her paged

## 2020-02-16 NOTE — Telephone Encounter (Signed)
FollowUp:    Pt said she stopped taking her Metoprolol and Flecainide.

## 2020-02-16 NOTE — Telephone Encounter (Signed)
Mercedes Beltran is calling in to let Dr. Meda Beltran know that she was advised by her PCP to stop taking her Metoprolol and Flecainide, and schedule an appt with our office to see Dr. Meda Beltran or an APP next week, for follow-up of this.  Mercedes Beltran states she was feeling very tired and fatigued, so she went into see her PCP for this on 6/1.  Mercedes Beltran states her PCP did an EKG and noted she was in sinus brady with a HR-45 BPM.  Mercedes Beltran states her PCP stopped her metoprolol and flecainide and advised her to schedule an appt with our office in a week.  Mercedes Beltran states her PCP was to send over progress notes and EKG done on her at the visit.  Mercedes Beltran states she has been off both meds since 6/1, and her symptoms have improved.  Scheduled the Mercedes Beltran to come in and see Mercedes Dopp PA-C for next Monday 6/21 at 3:15 pm.  Mercedes Beltran is aware to arrive 15 mins prior to that appt.   Informed the Mercedes Beltran that I will route this message to Dr. Meda Beltran as an Juluis Rainier, to make her aware of this plan.  Mercedes Beltran verbalized understanding and agrees with this plan.

## 2020-02-18 NOTE — Progress Notes (Signed)
Cardiology Office Note:    Date:  02/19/2020   ID:  Mercedes Beltran, DOB 11-Nov-1965, MRN [id number]  PCP:  Imagene Riches, NP  Cardiologist:  Ena Dawley, MD  Electrophysiologist:  None   Referring MD: Imagene Riches, NP   Chief Complaint:  Bradycardia    Patient Profile:    Mercedes Beltran is a 54 y.o. female with:   Hyperlipidemia   Family hx of premature CAD  COPD  Coronary CTA 2018: Ca score 0, no CAD  Coronary CTA 10/2019: Ca score 0, no CAD  Palpitations   Prior CV studies: ETT 11/21/2019 Low risk, no ischemia  Cardiac Monitor 08/2019 Sinus bradycardia to sinus tachycardia. Five episodes of very short SVTs, the longest lasting 18 beats. No atrial fibrillation. Symptoms don't correlate with arrhythmias.  Echocardiogram 06/30/2019 EF 60-65, no RWMA, normal RVSF  Coronary CTA 10/19/2019 Ca score = 0 No evidence of CAD  History of Present Illness:    Mercedes Beltran was last seen by Dr. Meda Coffee in 10/2019.  She had symptoms of palpitations and a cardiac monitor showed five very short brusts of SVT (longest 18 beats).  She was started on Flecainide in addition to Metoprolol.    She called the office recently after a visit with her PCP for symptoms of fatigue.  Her flecainide and metoprolol were stopped due to a HR of 45.  She was asked to follow up with Cardiology.  She is here alone today.  I did receive records from her PCP.  These were personally reviewed.  Her electrocardiograms were reviewed.  These are difficult to see as the photocopy is very light.  It is evident that this is sinus bradycardia with a heart rate of 45.  Labs from her PCP dated 02/16/2020: Potassium 4.4, creatinine 0.67, ALT 34, LDL 112, hemoglobin 15.1, TSH 1.067.    The patient notes that she took flecainide along with metoprolol for about 3 to 4 months.  It made her feel fatigued.  She stopped this several weeks ago.  She did feel better off of these medications.  She was off of these medications when she  saw her PCP last week and she still felt tired.  Today she feels better.  She has not had recurrent palpitations.  She has not had syncope.  Although, she does describe an episode of syncope about 1 year ago.  She did have a prodrome with this.  It occurred while walking from 1 room to the next.  She has occasional chest discomfort that she attributes to acid reflux.  She takes antacids for this.  She has not had orthopnea, PND.  She has some dependent leg edema.  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   Biotin 1 MG CAPS Take 1 capsule by mouth daily.   MULTIPLE VITAMIN PO Take 1 tablet by mouth daily.   RABEprazole (ACIPHEX) 20 MG tablet TAKE 2 CAPSULES BY MOUTH ONCE A DAY BEFORE MEALS     Allergies:   Demerol  [meperidine hcl], Codeine, Demerol [meperidine], and Morphine and related  Social History   Tobacco Use   Smoking status: Former Smoker    Quit date: 02/20/2011    Years since quitting: 9.0   Smokeless tobacco: Never Used   Tobacco comment: quit 01/2011  Substance Use Topics   Alcohol use: No    Alcohol/week: 0.0 standard drinks   Drug use: Not on file     Family Hx: The patient's family history includes Breast cancer in her paternal aunt; Cancer in her maternal grandfather and mother; Heart disease in her maternal grandmother.  Review of Systems  Constitutional: Negative for fever.  Respiratory: Negative for cough.   Gastrointestinal: Negative for hematochezia and melena.  Genitourinary: Negative for hematuria.     EKGs/Labs/Other Test Reviewed:    EKG:  EKG is   ordered today.  The ekg ordered  today demonstrates sinus bradycardia, HR 58, normal axis, nonspecific ST-T wave changes, QTC 457, no change from prior tracing  Recent Labs: 06/08/2019: BUN 9; Creatinine, Ser 0.75; Hemoglobin 14.8; Platelets 333; Potassium 3.7; Sodium 137   Recent Lipid Panel No results found for: CHOL, TRIG, HDL, CHOLHDL, LDLCALC, LDLDIRECT  Physical Exam:    VS:  BP 114/68    Pulse (!) 58    Ht 5\' 5"  (1.651 m)    Wt 164 lb (74.4 kg)    SpO2 96%    BMI 27.29 kg/m     Wt Readings from Last 3 Encounters:  02/19/20 164 lb (74.4 kg)  11/06/19 157 lb 3.2 oz (71.3 kg)  10/23/19 165 lb 6.4 oz (75 kg)     Constitutional:      Appearance: Healthy appearance. Not in distress.  Neck:     Thyroid: No thyromegaly.     Vascular: JVD normal.  Pulmonary:     Effort: Pulmonary effort is normal.     Breath sounds: No wheezing. No rales.  Cardiovascular:     Normal rate. Regular rhythm. Normal S1. Normal S2.     Murmurs: There is no murmur.  Edema:    Peripheral edema absent.  Abdominal:     Palpations: Abdomen is soft. There is no hepatomegaly.  Skin:    General: Skin is warm and dry.  Neurological:     Mental Status: Alert and oriented to person, place and time.     Cranial Nerves: Cranial nerves are intact.      ASSESSMENT & PLAN:    1. Palpitations 2. Bradycardia 3. SVT (supraventricular tachycardia) ([place]) I reviewed her recent event monitor.  She had several patient triggered events.  A couple of those were with the episodes of SVT.  These episodes were fairly brief.  When she saw her PCP recently, her heart rate was in the mid 47s.  I also reviewed her stress test.  She had no evidence of chronotropic incompetence on this study.  She was able to increase her heart rate from 52 to 151.  She does have a hx of 1 episode of syncope that occurred about a year ago.  She did have a prodrome with this which suggests the possibility of neurocardiogenic syncope.  In any event, she has had noted bradycardia  that seems to be symptomatic as well as brief episodes of SVT.  Based upon the patient triggered event on her monitor, it seems that these were also possibly symptomatic.  She clearly cannot tolerate beta-blocker therapy.  She felt poorly with flecainide.  Therefore, I recommended that we refer her to EP for further evaluation.  At this point, she  does not seem to meet any criteria for pacemaker implantation.  Question if a loop recorder would be helpful in her further management.  -Refer to EP.    Dispo:  Return for Evaluation with EP.   Medication Adjustments/Labs and Tests Ordered: Current medicines are reviewed at length with the patient today.  Concerns regarding medicines are outlined above.  Tests Ordered: Orders Placed This Encounter  Procedures   Ambulatory referral to Cardiac Electrophysiology   EKG 12-Lead   Medication Changes: No orders of the defined types were placed in this encounter.   Signed, Richardson Dopp, PA-C  02/19/2020 [facility] [address] [address], [place], [place]  [address] Phone: [phone]; Fax: [phone]

## 2020-02-19 ENCOUNTER — Encounter: Payer: Self-pay | Admitting: Physician Assistant

## 2020-02-19 ENCOUNTER — Other Ambulatory Visit: Payer: Self-pay

## 2020-02-19 ENCOUNTER — Ambulatory Visit: Payer: BC Managed Care – PPO | Admitting: Physician Assistant

## 2020-02-19 VITALS — BP 114/68 | HR 58 | Ht 65.0 in | Wt 164.0 lb

## 2020-02-19 DIAGNOSIS — I471 Supraventricular tachycardia: Secondary | ICD-10-CM

## 2020-02-19 DIAGNOSIS — R002 Palpitations: Secondary | ICD-10-CM

## 2020-02-19 DIAGNOSIS — R001 Bradycardia, unspecified: Secondary | ICD-10-CM | POA: Diagnosis not present

## 2020-02-19 NOTE — Patient Instructions (Signed)
Medication Instructions:   Your physician recommends that you continue on your current medications as directed. Please refer to the Current Medication list given to you today.  *If you need a refill on your cardiac medications before your next appointment, please call your pharmacy*  Lab Work:  None ordered today  Testing/Procedures:  None ordered today  Follow-Up: At [facility], you and your health needs are our priority.  As part of our continuing mission to provide you with exceptional heart care, we have created designated Provider Care Teams.  These Care Teams include your primary Cardiologist (physician) and Advanced Practice Providers (APPs -  Physician Assistants and Nurse Practitioners) who all work together to provide you with the care you need, when you need it.  A referral has been to start to our Cardiac Electrophysiology department, a scheduler will call you to set up an appointment.

## 2020-02-20 ENCOUNTER — Encounter: Payer: Self-pay | Admitting: *Deleted

## 2020-03-06 ENCOUNTER — Other Ambulatory Visit: Payer: Self-pay

## 2020-03-06 ENCOUNTER — Ambulatory Visit: Payer: BC Managed Care – PPO | Admitting: Family Medicine

## 2020-03-06 ENCOUNTER — Encounter: Payer: Self-pay | Admitting: Family Medicine

## 2020-03-06 DIAGNOSIS — M545 Low back pain, unspecified: Secondary | ICD-10-CM

## 2020-03-06 MED ORDER — TIZANIDINE HCL 2 MG PO TABS: 2.0000 mg | ORAL_TABLET | Freq: Four times a day (QID) | ORAL | 1 refills | Status: AC | PRN

## 2020-03-06 MED ORDER — NABUMETONE 750 MG PO TABS
750.0000 mg | ORAL_TABLET | Freq: Two times a day (BID) | ORAL | 6 refills | Status: DC | PRN
Start: 2020-03-06 — End: 2022-03-16

## 2020-03-06 NOTE — Progress Notes (Signed)
Office Visit Note   Patient: Mercedes Beltran           Date of Birth: 1966/01/22           MRN: [id number] Visit Date: 03/06/2020 Requested by: Imagene Riches, NP [address],  [place] [address] PCP: Imagene Riches, NP  Subjective: Chief Complaint  Patient presents with  . Lower Back - Pain    Pain flared up 02/20/20. Unsure of any specific injury. 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 Had plain Lsp xrays on 02/21/20 at [facility] - brought the report only.    HPI: She is here with low back/right posterior hip pain.  Symptoms started about 2 weeks ago.  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  She started feeling pain in her posterior hip.  Pain continued so she went to her PCP who obtained x-rays which were read as showing a possible left L5 pars defect and a remote right L2 pars defect with mild degenerative disc disease at L5-S1.  She brought the report with her but I do not have the actual images to review.  She was given a prednisone Dosepak which helped, but the pain returned as soon as the medicine ended.  She has a history of low back pain a couple years ago, we had x-rays in our system showing mild L5-S1 degenerative disc disease.  Patient denies radicular pain.  Denies bowel or bladder dysfunction.               ROS: No fever or chills.  All other systems were reviewed and are negative.  Objective: Vital Signs: There were no vitals taken for this visit.  Physical Exam:  General:  Alert and oriented, in no acute distress. Pulm:  Breathing unlabored. Psy:  Normal mood, congruent affect. Skin: No visible rash. Low back: She is tender near the right SI joint and in the gluteus medius area.  She has a tender trigger point in this region.  She is tender to palpation in the upper lumbar spinous processes in the midline, but this is not where she points as the location of her pain.   Straight leg raise negative, lower extremity strength and reflexes are normal.  Imaging: No results found.  Assessment & Plan: 1.  Right-sided low back/posterior hip pain, possibly SI joint dysfunction versus myofascial pain.  Neurologic exam is nonfocal. -I recommended a trial of physical therapy.  Relafen and Zanaflex as needed.  If pain persist, we will order MRI scan.  Otherwise I will see her back as needed.     Procedures: No procedures performed  No notes on file     PMFS History: Patient Active Problem List   Diagnosis Date Noted  . COPD (chronic obstructive pulmonary disease) ([place]) 11/06/2019  . DOE (dyspnea on exertion) 05/14/2017  . Low back pain radiating to right leg 04/09/2017  . 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 Abnormal MRI, pelvis   . Iron deficiency anemia    Past Medical History:  Diagnosis Date  . Bradycardia   . COPD (chronic obstructive pulmonary disease) ([place])   . Fatty liver   . GERD (gastroesophageal reflux disease)   . Headache   . History of hiatal hernia   . 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 Shortness of breath dyspnea    since 08/2014    Family History  Problem Relation Age of  Onset  . Breast cancer Paternal Aunt        x6, not sure of ages of onset  . Cancer Mother        colon, liver,lung  . Heart disease Maternal Grandmother   . Cancer Maternal Grandfather     Past Surgical History:  Procedure Laterality Date  . ABDOMINAL HYSTERECTOMY    . CHOLECYSTECTOMY     Social History   Occupational History  . Not on file  Tobacco Use  . Smoking status: Former Smoker    Quit date: 02/20/2011    Years since quitting: 9.0  . Smokeless tobacco: Never Used  . Tobacco comment: quit 01/2011  Substance and Sexual Activity  . Alcohol use: No    Alcohol/week: 0.0 standard drinks  . Drug use: Not on file  . Sexual activity: Yes

## 2020-03-20 ENCOUNTER — Ambulatory Visit: Payer: BC Managed Care – PPO | Admitting: Orthopaedic Surgery

## 2020-03-26 ENCOUNTER — Encounter: Payer: Self-pay | Admitting: *Deleted

## 2020-03-26 ENCOUNTER — Encounter: Payer: Self-pay | Admitting: Internal Medicine

## 2020-03-26 ENCOUNTER — Other Ambulatory Visit: Payer: Self-pay

## 2020-03-26 ENCOUNTER — Ambulatory Visit: Payer: BC Managed Care – PPO | Admitting: Internal Medicine

## 2020-03-26 VITALS — BP 114/64 | HR 53 | Ht 65.0 in | Wt 163.0 lb

## 2020-03-26 DIAGNOSIS — R001 Bradycardia, unspecified: Secondary | ICD-10-CM | POA: Diagnosis not present

## 2020-03-26 DIAGNOSIS — I471 Supraventricular tachycardia: Secondary | ICD-10-CM | POA: Diagnosis not present

## 2020-03-26 DIAGNOSIS — R002 Palpitations: Secondary | ICD-10-CM

## 2020-03-26 NOTE — Patient Instructions (Addendum)
Medication Instructions:  Your physician recommends that you continue on your current medications as directed. Please refer to the Current Medication list given to you today.  *If you need a refill on your cardiac medications before your next appointment, please call your pharmacy*  Lab Work: None ordered.  If you have labs (blood work) drawn today and your tests are completely normal, you will receive your results only by: Marland Kitchen MyChart Message (if you have MyChart) OR . A paper copy in the mail If you have any lab test that is abnormal or we need to change your treatment, we will call you to review the results.  Testing/Procedures: None ordered.  Follow-Up: At [facility], you and your health needs are our priority.  As part of our continuing mission to provide you with exceptional heart care, we have created designated Provider Care Teams.  These Care Teams include your primary Cardiologist (physician) and Advanced Practice Providers (APPs -  Physician Assistants and Nurse Practitioners) who all work together to provide you with the care you need, when you need it.  We recommend signing up for the patient portal called "MyChart".  Sign up information is provided on this After Visit Summary.  MyChart is used to connect with patients for Virtual Visits (Telemedicine).  Patients are able to view lab/test results, encounter notes, upcoming appointments, etc.  Non-urgent messages can be sent to your provider as well.   To learn more about what you can do with MyChart, go to [email or web address].    Your next appointment:   Your physician wants you to follow-up in: Aug 23,2021 9 am at the [address] office with Dr. Lovena Le    Other Instructions:  Implantable Loop Recorder Placement  An implantable loop recorder is a small electronic device that is placed under the skin of your chest. It is about the size of an AA ("double A") battery. The device records the electrical activity of your  heart over a long period of time. Your health care provider can download these recordings to monitor your heart. You may need an implantable loop recorder if you have periods of abnormal heart activity (arrhythmias) or unexplained fainting (syncope). The recorder can be left in place for 3 year or longer.

## 2020-03-26 NOTE — Progress Notes (Signed)
HPI Mercedes Beltran is referred today by Richardson Dopp, PA-C for evaluation of unexplained syncope. She is a pleasant middle aged woman with a h/o palpitations who has had 2 episodes of syncope. The most recent occurred about 3 months ago and was without warning. She was out for a few seconds. She felt ok on awakening and denies nausea or associated vomiting or incontinence. She has worn a cardiac monitor demonstrating NSR with very brief episodes of SVT, likely NS atrial tachycardia as well as sinus bradycardia. She has preserved LV function by echo.  Allergies  Allergen Reactions  . Demerol  [Meperidine Hcl] Nausea And Vomiting  . Codeine Nausea And Vomiting  . Demerol [Meperidine] Nausea And Vomiting  . Morphine And Related Nausea And Vomiting     Current Outpatient Medications  Medication Sig Dispense Refill  . aspirin EC 81 MG tablet Take 81 mg by mouth daily.    . Biotin 1 MG CAPS Take 1 capsule by mouth daily.    . MULTIPLE VITAMIN PO Take 1 tablet by mouth daily.    . nabumetone (RELAFEN) 750 MG tablet Take 1 tablet (750 mg total) by mouth 2 (two) times daily as needed. 60 tablet 6  . predniSONE (STERAPRED UNI-PAK 21 TAB) 10 MG (21) TBPK tablet Take by mouth as directed.    . RABEprazole (ACIPHEX) 20 MG tablet TAKE 2 CAPSULES BY MOUTH ONCE A DAY BEFORE MEALS    . tiZANidine (ZANAFLEX) 2 MG tablet Take 1-2 tablets (2-4 mg total) by mouth every 6 (six) hours as needed for muscle spasms. 60 tablet 1  . nitroGLYCERIN (NITROSTAT) 0.4 MG SL tablet PLACE 1 TABLET (0.4 MG TOTAL) UNDER THE TONGUE EVERY 5 (FIVE) MINUTES AS NEEDED FOR CHEST PAIN. 25 tablet 3   No current facility-administered medications for this visit.     Past Medical History:  Diagnosis Date  . Bradycardia   . COPD (chronic obstructive pulmonary disease) ([place])   . Fatty liver   . GERD (gastroesophageal reflux disease)   . Headache   . History of hiatal hernia   . 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 Shortness of  breath dyspnea    since 08/2014    ROS:   All systems reviewed and negative except as noted in the HPI.   Past Surgical History:  Procedure Laterality Date  . ABDOMINAL HYSTERECTOMY    . CHOLECYSTECTOMY       Family History  Problem Relation Age of Onset  . Breast cancer Paternal Aunt        x6, not sure of ages of onset  . Cancer Mother        colon, liver,lung  . Heart disease Maternal Grandmother   . Cancer Maternal Grandfather      Social History   Socioeconomic History  . Marital status: Married    Spouse name: Not on file  . Number of children: Not on file  . Years of education: Not on file  . Highest education level: Not on file  Occupational History  . Not on file  Tobacco Use  . Smoking status: Former Smoker    Quit date: 02/20/2011    Years since quitting: 9.1  . Smokeless tobacco: Never Used  . Tobacco comment: quit 01/2011  Substance and Sexual Activity  . Alcohol use: No    Alcohol/week: 0.0 standard drinks  . Drug use: Not on file  . Sexual activity: Yes  Other Topics Concern  . Not on file  Social History Narrative  . Not on file   Social Determinants of Health   Financial Resource Strain:   . Difficulty of Paying Living Expenses:   Food Insecurity:   . Worried About Charity fundraiser in the Last Year:   . Arboriculturist in the Last Year:   Transportation Needs:   . Film/video editor (Medical):   Marland Kitchen Lack of Transportation (Non-Medical):   Physical Activity:   . Days of Exercise per Week:   . Minutes of Exercise per Session:   Stress:   . Feeling of Stress :   Social Connections:   . Frequency of Communication with Friends and Family:   . Frequency of Social Gatherings with Friends and Family:   . Attends Religious Services:   . Active Member of Clubs or Organizations:   . Attends Archivist Meetings:   Marland Kitchen Marital Status:   Intimate Partner Violence:   . Fear of Current or Ex-Partner:   . Emotionally Abused:   Marland Kitchen  Physically Abused:   . Sexually Abused:      BP (!) 114/64   Pulse 53   Ht 5\' 5"  (1.651 m)   Wt 163 lb (73.9 kg)   SpO2 97%   BMI 27.12 kg/m   Physical Exam:  Well appearing middle aged woman, NAD HEENT: Unremarkable Neck:  6 cm JVD, no thyromegally Lymphatics:  No adenopathy Back:  No CVA tenderness Lungs:  Clear with no wheezes HEART:  Regular rate rhythm, no murmurs, no rubs, no clicks Abd:  soft, positive bowel sounds, no organomegally, no rebound, no guarding Ext:  2 plus pulses, no edema, no cyanosis, no clubbing Skin:  No rashes no nodules Neuro:  CN II through XII intact, motor grossly intact  EKG - sinus bradycardia  Assess/Plan: 1. Syncope - the etiology is unclear. I have discussed the possible causes of her syncope and recommended ILR insertion. She will call us if she would like to proceed. 2. SVT - she has very brief NS SVT, likely AT. I would recommend she undergo watchful waiting. Her toprol was stopped due to bradycardia. 3. Sinus node dysfunction - the patient has had sinus rates in the 40s and her beta blocker was stopped. She does not have any obvious symptoms.   Mikle [email or web address].D

## 2020-04-22 ENCOUNTER — Ambulatory Visit (INDEPENDENT_AMBULATORY_CARE_PROVIDER_SITE_OTHER): Payer: BC Managed Care – PPO | Admitting: Internal Medicine

## 2020-04-22 ENCOUNTER — Other Ambulatory Visit: Payer: Self-pay

## 2020-04-22 DIAGNOSIS — I471 Supraventricular tachycardia, unspecified: Secondary | ICD-10-CM | POA: Insufficient documentation

## 2020-04-22 DIAGNOSIS — R55 Syncope and collapse: Secondary | ICD-10-CM

## 2020-04-22 DIAGNOSIS — I495 Sick sinus syndrome: Secondary | ICD-10-CM

## 2020-04-22 NOTE — Patient Instructions (Addendum)
Medication Instructions:  Your physician recommends that you continue on your current medications as directed. Please refer to the Current Medication list given to you today.  Labwork: None ordered.  Testing/Procedures: None ordered.  Follow-Up:  Your physician wants you to follow-up in: 6 months with Dr. Taylor.   You will receive a reminder letter in the mail two months in advance. If you don't receive a letter, please call our office to schedule the follow-up appointment.      Implantable Loop Recorder Placement, Care After This sheet gives you information about how to care for yourself after your procedure. Your health care provider may also give you more specific instructions. If you have problems or questions, contact your health care provider. What can I expect after the procedure? After the procedure, it is common to have:  Soreness or discomfort near the incision.  Some swelling or bruising near the incision.  Follow these instructions at home: Incision care  1.  Leave your outer dressing on for 72 hours.  After 72 hours you can remove your outer dressing and shower. 2. Leave adhesive strips in place. These skin closures may need to stay in place for 1-2 weeks. If adhesive strip edges start to loosen and curl up, you may trim the loose edges.  You may remove the strips if they have not fallen off after 2 weeks. 3. Check your incision area every day for signs of infection. Check for: a. Redness, swelling, or pain. b. Fluid or blood. c. Warmth. d. Pus or a bad smell. 4. Do not take baths, swim, or use a hot tub until your incision is completely healed. 5. If your wound site starts to bleed apply pressure.      If you have any questions/concerns please call the device clinic at [phone].  Activity  Return to your normal activities.  General instructions  Follow instructions from your health care provider about how to manage your implantable loop recorder and  transmit the information. Learn how to activate a recording if this is necessary for your type of device.  Do not go through a metal detection gate, and do not let someone hold a metal detector over your chest. Show your ID card.  Do not have an MRI unless you check with your health care provider first.  Take over-the-counter and prescription medicines only as told by your health care provider.  Keep all follow-up visits as told by your health care provider. This is important. Contact a health care provider if:  You have redness, swelling, or pain around your incision.  You have a fever.  You have pain that is not relieved by your pain medicine.  You have triggered your device because of fainting (syncope) or because of a heartbeat that feels like it is racing, slow, fluttering, or skipping (palpitations). Get help right away if you have:  Chest pain.  Difficulty breathing. Summary  After the procedure, it is common to have soreness or discomfort near the incision.  Change your dressing as told by your health care provider.  Follow instructions from your health care provider about how to manage your implantable loop recorder and transmit the information.  Keep all follow-up visits as told by your health care provider. This is important. This information is not intended to replace advice given to you by your health care provider. Make sure you discuss any questions you have with your health care provider. Document Released: 07/29/2015 Document Revised: 10/02/2017 Document Reviewed: 10/02/2017 Elsevier Patient   [place].

## 2020-04-22 NOTE — Progress Notes (Signed)
HPI Mercedes Beltran returns today for ongoing evaluation of unexplained syncope.  She is a very pleasant middle-aged woman with 2 episodes of syncope dating back several months.  She wore a cardiac monitor which demonstrated sinus rhythm and nonsustained atrial tachycardia and sinus bradycardia all of which were asymptomatic.  She has preserved left ventricular systolic function.  When I saw the patient several weeks ago, she has undergone exercise testing which did not demonstrate chronotropic incompetence.  She presents today to consider insertion of an implantable loop recorder. Allergies  Allergen Reactions  . Demerol  [Meperidine Hcl] Nausea And Vomiting  . Codeine Nausea And Vomiting  . Demerol [Meperidine] Nausea And Vomiting  . Morphine And Related Nausea And Vomiting     Current Outpatient Medications  Medication Sig Dispense Refill  . aspirin EC 81 MG tablet Take 81 mg by mouth daily.    . Biotin 1 MG CAPS Take 1 capsule by mouth daily.    . MULTIPLE VITAMIN PO Take 1 tablet by mouth daily.    . nabumetone (RELAFEN) 750 MG tablet Take 1 tablet (750 mg total) by mouth 2 (two) times daily as needed. 60 tablet 6  . nitroGLYCERIN (NITROSTAT) 0.4 MG SL tablet PLACE 1 TABLET (0.4 MG TOTAL) UNDER THE TONGUE EVERY 5 (FIVE) MINUTES AS NEEDED FOR CHEST PAIN. 25 tablet 3  . predniSONE (STERAPRED UNI-PAK 21 TAB) 10 MG (21) TBPK tablet Take by mouth as directed.    . RABEprazole (ACIPHEX) 20 MG tablet TAKE 2 CAPSULES BY MOUTH ONCE A DAY BEFORE MEALS    . tiZANidine (ZANAFLEX) 2 MG tablet Take 1-2 tablets (2-4 mg total) by mouth every 6 (six) hours as needed for muscle spasms. 60 tablet 1   No current facility-administered medications for this visit.     Past Medical History:  Diagnosis Date  . Abnormal MRI, pelvis   . Bradycardia   . COPD (chronic obstructive pulmonary disease) ([place])   . DOE (dyspnea on exertion) 05/14/2017  . Fatty liver   . GERD (gastroesophageal reflux disease)     . Headache   . History of hiatal hernia   . Iron deficiency anemia   . 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 Shortness of breath dyspnea    since 08/2014    ROS:   All systems reviewed and negative except as noted in the HPI.   Past Surgical History:  Procedure Laterality Date  . ABDOMINAL HYSTERECTOMY    . CHOLECYSTECTOMY       Family History  Problem Relation Age of Onset  . Breast cancer Paternal Aunt        x6, not sure of ages of onset  . Cancer Mother        colon, liver,lung  . Heart disease Maternal Grandmother   . Cancer Maternal Grandfather      Social History   Socioeconomic History  . Marital status: Married    Spouse name: Not on file  . Number of children: Not on file  . Years of education: Not on file  . Highest education level: Not on file  Occupational History  . Not on file  Tobacco Use  . Smoking status: Former Smoker    Quit date: 02/20/2011    Years since quitting: 9.1  . Smokeless tobacco: Never Used  . Tobacco comment: quit 01/2011  Substance and Sexual Activity  . Alcohol use: No    Alcohol/week: 0.0 standard drinks  . Drug use: Not on file  .  Sexual activity: Yes  Other Topics Concern  . Not on file  Social History Narrative  . Not on file   Social Determinants of Health   Financial Resource Strain:   . Difficulty of Paying Living Expenses: Not on file  Food Insecurity:   . Worried About Charity fundraiser in the Last Year: Not on file  . Ran Out of Food in the Last Year: Not on file  Transportation Needs:   . Lack of Transportation (Medical): Not on file  . Lack of Transportation (Non-Medical): Not on file  Physical Activity:   . Days of Exercise per Week: Not on file  . Minutes of Exercise per Session: Not on file  Stress:   . Feeling of Stress : Not on file  Social Connections:   . Frequency of Communication with Friends and Family: Not on file  . Frequency of Social Gatherings with Friends and Family: Not on file  .  Attends Religious Services: Not on file  . Active Member of Clubs or Organizations: Not on file  . Attends Archivist Meetings: Not on file  . Marital Status: Not on file  Intimate Partner Violence:   . Fear of Current or Ex-Partner: Not on file  . Emotionally Abused: Not on file  . Physically Abused: Not on file  . Sexually Abused: Not on file     BP 118/72   Pulse 68   Ht 5\' 5"  (1.651 m)   Wt 165 lb (74.8 kg)   BMI 27.46 kg/m   Physical Exam:  Well appearing NAD HEENT: Unremarkable Neck:  No JVD, no thyromegally Lymphatics:  No adenopathy Back:  No CVA tenderness Lungs:  Clear, with no wheezes, rales, or rhonchi HEART:  Regular rate rhythm, no murmurs, no rubs, no clicks Abd:  soft, positive bowel sounds, no organomegally, no rebound, no guarding Ext:  2 plus pulses, no edema, no cyanosis, no clubbing Skin:  No rashes no nodules Neuro:  CN II through XII intact, motor grossly intact   Assess/Plan: 1.  Unexplained syncope -I discussed the treatment options again with the patient and she wishes to proceed with insertion of an implantable loop recorder.  EP procedure note  Procedure performed: Insertion of an implantable loop recorder  Preoperative diagnosis: Unexplained syncope  Postoperative diagnosis: Unexplained syncope  Description of the procedure: After informed consent was obtained, the patient was prepped and draped in a sterile manner.  10 cc of lidocaine was infiltrated into the left pectoral region.  A 1 cm stab incision was carried out.  The Medtronic implantable loop recorder, serial number [id number], was inserted under the skin.  R waves measured 0.5 mV.  Benzoin and Steri-Strips were painted on the skin.  A bandage was applied.  The patient was recovered in the usual manner.  Complications: There were no immediate procedure complications  Conclusion: Successful insertion of a Medtronic implantable loop recorder in a patient with unexplained  syncope.  Crissie Sickles, MD    Description of the procedure:

## 2020-05-27 ENCOUNTER — Ambulatory Visit (INDEPENDENT_AMBULATORY_CARE_PROVIDER_SITE_OTHER): Payer: BC Managed Care – PPO | Admitting: Emergency Medicine

## 2020-05-27 DIAGNOSIS — R55 Syncope and collapse: Secondary | ICD-10-CM

## 2020-05-27 LAB — CUP PACEART REMOTE DEVICE CHECK
Date Time Interrogation Session: 20210925110425
Implantable Pulse Generator Implant Date: 20210823

## 2020-05-30 NOTE — Progress Notes (Signed)
[facility] Summary Report / Loop Recorder 

## 2020-07-01 ENCOUNTER — Ambulatory Visit (INDEPENDENT_AMBULATORY_CARE_PROVIDER_SITE_OTHER): Payer: BC Managed Care – PPO

## 2020-07-01 DIAGNOSIS — R55 Syncope and collapse: Secondary | ICD-10-CM | POA: Diagnosis not present

## 2020-07-01 LAB — CUP PACEART REMOTE DEVICE CHECK
Date Time Interrogation Session: 20211028110655
Implantable Pulse Generator Implant Date: 20210823

## 2020-07-04 NOTE — Progress Notes (Signed)
[facility] Summary Report / Loop Recorder 

## 2020-08-03 LAB — CUP PACEART REMOTE DEVICE CHECK
Date Time Interrogation Session: 20211130110732
Implantable Pulse Generator Implant Date: 20210823

## 2020-08-05 ENCOUNTER — Ambulatory Visit (INDEPENDENT_AMBULATORY_CARE_PROVIDER_SITE_OTHER): Payer: BC Managed Care – PPO

## 2020-08-05 DIAGNOSIS — R55 Syncope and collapse: Secondary | ICD-10-CM

## 2020-08-14 NOTE — Progress Notes (Signed)
[facility] Summary Report / Loop Recorder 

## 2020-09-02 ENCOUNTER — Ambulatory Visit (INDEPENDENT_AMBULATORY_CARE_PROVIDER_SITE_OTHER): Payer: BC Managed Care – PPO

## 2020-09-02 DIAGNOSIS — R55 Syncope and collapse: Secondary | ICD-10-CM

## 2020-09-02 LAB — CUP PACEART REMOTE DEVICE CHECK
Date Time Interrogation Session: 20220102110624
Implantable Pulse Generator Implant Date: 20210823

## 2020-09-10 ENCOUNTER — Ambulatory Visit: Payer: BC Managed Care – PPO | Admitting: Internal Medicine

## 2020-09-17 NOTE — Progress Notes (Signed)
[facility] Summary Report / Loop Recorder 

## 2020-09-19 ENCOUNTER — Encounter: Payer: Self-pay | Admitting: Nurse Practitioner

## 2020-09-19 ENCOUNTER — Ambulatory Visit: Payer: Self-pay | Admitting: Student

## 2020-09-19 ENCOUNTER — Other Ambulatory Visit: Payer: Self-pay

## 2020-09-19 ENCOUNTER — Ambulatory Visit: Payer: BC Managed Care – PPO | Admitting: Nurse Practitioner

## 2020-09-19 VITALS — BP 102/70 | HR 88 | Ht 65.0 in | Wt 165.6 lb

## 2020-09-19 DIAGNOSIS — R55 Syncope and collapse: Secondary | ICD-10-CM | POA: Diagnosis not present

## 2020-09-19 NOTE — Progress Notes (Signed)
Electrophysiology Office Note Date: 09/19/2020  ID:  Mercedes Beltran Dec 19, 1965, MRN [id number]  PCP: Imagene Riches, NP Electrophysiologist: Lovena Le  CC: syncope follow up  Mercedes Beltran is a 55 y.o. female seen today for Dr Lovena Le.  She presents today for routine electrophysiology followup.  Since last being seen in our clinic, the patient reports doing very well.  She denies chest pain, palpitations, dyspnea, PND, orthopnea, nausea, vomiting, dizziness, syncope, edema, weight gain, or early satiety.  Past Medical History:  Diagnosis Date  . Abnormal MRI, pelvis   . Bradycardia   . COPD (chronic obstructive pulmonary disease) ([place])   . DOE (dyspnea on exertion) 05/14/2017  . Fatty liver   . GERD (gastroesophageal reflux disease)   . Headache   . History of hiatal hernia   . Iron deficiency anemia   . 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 Shortness of breath dyspnea    since 08/2014   Past Surgical History:  Procedure Laterality Date  . ABDOMINAL HYSTERECTOMY    . CHOLECYSTECTOMY      Current Outpatient Medications  Medication Sig Dispense Refill  . aspirin EC 81 MG tablet Take 81 mg by mouth daily.    . Biotin 1 MG CAPS Take 1 capsule by mouth daily.    . MULTIPLE VITAMIN PO Take 1 tablet by mouth daily.    . nabumetone (RELAFEN) 750 MG tablet Take 1 tablet (750 mg total) by mouth 2 (two) times daily as needed. 60 tablet 6  . nitroGLYCERIN (NITROSTAT) 0.4 MG SL tablet PLACE 1 TABLET (0.4 MG TOTAL) UNDER THE TONGUE EVERY 5 (FIVE) MINUTES AS NEEDED FOR CHEST PAIN. 25 tablet 3  . RABEprazole (ACIPHEX) 20 MG tablet TAKE 2 CAPSULES BY MOUTH ONCE A DAY BEFORE MEALS    . tiZANidine (ZANAFLEX) 2 MG tablet Take 1-2 tablets (2-4 mg total) by mouth every 6 (six) hours as needed for muscle spasms. 60 tablet 1   No current facility-administered medications for this visit.    Allergies:   Demerol  [meperidine hcl], Codeine, Demerol [meperidine], and Morphine and related   Social  History: Social History   Socioeconomic History  . Marital status: Married    Spouse name: Not on file  . Number of children: Not on file  . Years of education: Not on file  . Highest education level: Not on file  Occupational History  . Not on file  Tobacco Use  . Smoking status: Former Smoker    Quit date: 02/20/2011    Years since quitting: 9.5  . Smokeless tobacco: Never Used  . Tobacco comment: quit 01/2011  Substance and Sexual Activity  . Alcohol use: No    Alcohol/week: 0.0 standard drinks  . Drug use: Not on file  . Sexual activity: Yes  Other Topics Concern  . Not on file  Social History Narrative  . Not on file   Social Determinants of Health   Financial Resource Strain: Not on file  Food Insecurity: Not on file  Transportation Needs: Not on file  Physical Activity: Not on file  Stress: Not on file  Social Connections: Not on file  Intimate Partner Violence: Not on file    Family History: Family History  Problem Relation Age of Onset  . Breast cancer Paternal Aunt        x6, not sure of ages of onset  . Cancer Mother        colon, liver,lung  . Heart disease Maternal Grandmother   .  Cancer Maternal Grandfather     Review of Systems: All other systems reviewed and are otherwise negative except as noted above.   Physical Exam: VS:  BP 102/70   Pulse 88   Ht 5\' 5"  (1.651 m)   Wt 165 lb 9.6 oz (75.1 kg)   SpO2 95%   BMI 27.56 kg/m  , BMI Body mass index is 27.56 kg/m. Wt Readings from Last 3 Encounters:  09/19/20 165 lb 9.6 oz (75.1 kg)  04/22/20 165 lb (74.8 kg)  03/26/20 163 lb (73.9 kg)    GEN- The patient is well appearing, alert and oriented x 3 today.   HEENT: normocephalic, atraumatic; sclera clear, conjunctiva pink; hearing intact; oropharynx clear; neck supple Lungs- Clear to ausculation bilaterally, normal work of breathing.  No wheezes, rales, rhonchi Heart- Regular rate and rhythm, no murmurs, rubs or gallops GI- soft,  non-tender, non-distended, bowel sounds present Extremities- no clubbing, cyanosis, or edema  MS- no significant deformity or atrophy Skin- warm and dry, no rash or lesion  Psych- euthymic mood, full affect Neuro- strength and sensation are intact   EKG:  EKG is not ordered today.  Recent Labs: No results found for requested labs within last 8760 hours.    Other studies Reviewed: Additional studies/ records that were reviewed today include: Dr Tanna Furry notes  Assessment and Plan:  1.  Syncope No recurrence ILR interrogation demonstrates no arrhythmias since implant Symptom activator given today  Will continue to follow. Pt aware to call with recurrent symptoms   Current medicines are reviewed at length with the patient today.   The patient does not have concerns regarding her medicines.  The following changes were made today:  none  Labs/ tests ordered today include: none No orders of the defined types were placed in this encounter.    Disposition:   Follow up with Dr Lovena Le 1 year     Signed, Chanetta Marshall, NP 09/19/2020 8:33 AM   [address] [place] [address] [phone] (office) [phone] (fax)

## 2020-09-19 NOTE — Patient Instructions (Signed)
Medication Instructions:  Your physician recommends that you continue on your current medications as directed. Please refer to the Current Medication list given to you today.  *If you need a refill on your cardiac medications before your next appointment, please call your pharmacy*   Lab Work:  None ordered   Testing/Procedures: None ordered   Follow-Up: At [facility], you and your health needs are our priority.  As part of our continuing mission to provide you with exceptional heart care, we have created designated Provider Care Teams.  These Care Teams include your primary Cardiologist (physician) and Advanced Practice Providers (APPs -  Physician Assistants and Nurse Practitioners) who all work together to provide you with the care you need, when you need it.  We recommend signing up for the patient portal called "MyChart".  Sign up information is provided on this After Visit Summary.  MyChart is used to connect with patients for Virtual Visits (Telemedicine).  Patients are able to view lab/test results, encounter notes, upcoming appointments, etc.  Non-urgent messages can be sent to your provider as well.   To learn more about what you can do with MyChart, go to [email or web address].    Your next appointment:   12 month(s)  The format for your next appointment:   In Person  Provider:   Your physician recommends that you schedule a follow-up appointment in: Dr. Lovena Le in 12 months   Other Instructions Device number [phone]

## 2020-09-25 NOTE — Addendum Note (Signed)
Addended by: Douglass Rivers D on: 09/25/2020 11:08 AM   Modules accepted: Level of Service

## 2020-10-03 ENCOUNTER — Ambulatory Visit (INDEPENDENT_AMBULATORY_CARE_PROVIDER_SITE_OTHER): Payer: BC Managed Care – PPO

## 2020-10-03 DIAGNOSIS — R55 Syncope and collapse: Secondary | ICD-10-CM | POA: Diagnosis not present

## 2020-10-07 LAB — CUP PACEART REMOTE DEVICE CHECK
Date Time Interrogation Session: 20220204110414
Implantable Pulse Generator Implant Date: 20210823

## 2020-10-09 NOTE — Progress Notes (Signed)
[facility] Summary Report / Loop Recorder 

## 2020-11-04 ENCOUNTER — Ambulatory Visit (INDEPENDENT_AMBULATORY_CARE_PROVIDER_SITE_OTHER): Payer: BC Managed Care – PPO

## 2020-11-04 DIAGNOSIS — R55 Syncope and collapse: Secondary | ICD-10-CM

## 2020-11-07 LAB — CUP PACEART REMOTE DEVICE CHECK
Date Time Interrogation Session: 20220309110634
Implantable Pulse Generator Implant Date: 20210823

## 2020-11-12 NOTE — Progress Notes (Signed)
[facility] Summary Report / Loop Recorder 

## 2020-12-05 ENCOUNTER — Ambulatory Visit (INDEPENDENT_AMBULATORY_CARE_PROVIDER_SITE_OTHER): Payer: BC Managed Care – PPO

## 2020-12-05 DIAGNOSIS — R55 Syncope and collapse: Secondary | ICD-10-CM | POA: Diagnosis not present

## 2020-12-10 LAB — CUP PACEART REMOTE DEVICE CHECK
Date Time Interrogation Session: 20220411110646
Implantable Pulse Generator Implant Date: 20210823

## 2020-12-19 NOTE — Progress Notes (Signed)
[facility] Summary Report / Loop Recorder 

## 2021-01-06 ENCOUNTER — Ambulatory Visit (INDEPENDENT_AMBULATORY_CARE_PROVIDER_SITE_OTHER): Payer: BC Managed Care – PPO

## 2021-01-06 DIAGNOSIS — R55 Syncope and collapse: Secondary | ICD-10-CM | POA: Diagnosis not present

## 2021-01-13 LAB — CUP PACEART REMOTE DEVICE CHECK
Date Time Interrogation Session: 20220514110733
Implantable Pulse Generator Implant Date: 20210823

## 2021-01-23 NOTE — Progress Notes (Signed)
[facility] Summary Report / Loop Recorder 

## 2021-02-13 ENCOUNTER — Ambulatory Visit (INDEPENDENT_AMBULATORY_CARE_PROVIDER_SITE_OTHER): Payer: BC Managed Care – PPO

## 2021-02-13 DIAGNOSIS — R55 Syncope and collapse: Secondary | ICD-10-CM

## 2021-02-13 LAB — CUP PACEART REMOTE DEVICE CHECK
Date Time Interrogation Session: 20220616110427
Implantable Pulse Generator Implant Date: 20210823

## 2021-03-06 NOTE — Progress Notes (Signed)
[facility] Summary Report / Loop Recorder 

## 2021-03-17 ENCOUNTER — Ambulatory Visit (INDEPENDENT_AMBULATORY_CARE_PROVIDER_SITE_OTHER): Payer: BC Managed Care – PPO

## 2021-03-17 DIAGNOSIS — R55 Syncope and collapse: Secondary | ICD-10-CM

## 2021-03-18 LAB — CUP PACEART REMOTE DEVICE CHECK
Date Time Interrogation Session: 20220719110814
Implantable Pulse Generator Implant Date: 20210823

## 2021-04-08 NOTE — Progress Notes (Signed)
[facility] Summary Report / Loop Recorder 

## 2021-04-17 ENCOUNTER — Ambulatory Visit (INDEPENDENT_AMBULATORY_CARE_PROVIDER_SITE_OTHER): Payer: BC Managed Care – PPO

## 2021-04-17 DIAGNOSIS — R55 Syncope and collapse: Secondary | ICD-10-CM

## 2021-04-21 LAB — CUP PACEART REMOTE DEVICE CHECK
Date Time Interrogation Session: 20220821110923
Implantable Pulse Generator Implant Date: 20210823

## 2021-05-07 NOTE — Progress Notes (Signed)
[facility] Summary Report / Loop Recorder 

## 2021-05-19 ENCOUNTER — Ambulatory Visit (INDEPENDENT_AMBULATORY_CARE_PROVIDER_SITE_OTHER): Payer: BC Managed Care – PPO

## 2021-05-19 DIAGNOSIS — R55 Syncope and collapse: Secondary | ICD-10-CM | POA: Diagnosis not present

## 2021-05-24 LAB — CUP PACEART REMOTE DEVICE CHECK
Date Time Interrogation Session: 20220923110832
Implantable Pulse Generator Implant Date: 20210823

## 2021-05-26 NOTE — Progress Notes (Signed)
[facility] Summary Report / Loop Recorder 

## 2021-06-25 ENCOUNTER — Ambulatory Visit (INDEPENDENT_AMBULATORY_CARE_PROVIDER_SITE_OTHER): Payer: BC Managed Care – PPO

## 2021-06-25 DIAGNOSIS — I471 Supraventricular tachycardia: Secondary | ICD-10-CM

## 2021-06-25 LAB — CUP PACEART REMOTE DEVICE CHECK
Date Time Interrogation Session: 20221026110756
Implantable Pulse Generator Implant Date: 20210823

## 2021-06-30 NOTE — Progress Notes (Signed)
[facility] Summary Report / Loop Recorder 

## 2021-07-29 ENCOUNTER — Ambulatory Visit (INDEPENDENT_AMBULATORY_CARE_PROVIDER_SITE_OTHER): Payer: BC Managed Care – PPO

## 2021-07-29 DIAGNOSIS — R55 Syncope and collapse: Secondary | ICD-10-CM

## 2021-07-29 LAB — CUP PACEART REMOTE DEVICE CHECK
Date Time Interrogation Session: 20221128110648
Implantable Pulse Generator Implant Date: 20210823

## 2021-08-06 NOTE — Progress Notes (Signed)
[facility] Summary Report / Loop Recorder 

## 2021-08-29 ENCOUNTER — Other Ambulatory Visit: Payer: Self-pay | Admitting: Neurosurgery

## 2021-08-29 DIAGNOSIS — D320 Benign neoplasm of cerebral meninges: Secondary | ICD-10-CM

## 2021-09-02 ENCOUNTER — Ambulatory Visit (INDEPENDENT_AMBULATORY_CARE_PROVIDER_SITE_OTHER): Payer: BC Managed Care – PPO

## 2021-09-02 DIAGNOSIS — R55 Syncope and collapse: Secondary | ICD-10-CM | POA: Diagnosis not present

## 2021-09-02 LAB — CUP PACEART REMOTE DEVICE CHECK
Date Time Interrogation Session: 20230102230555
Implantable Pulse Generator Implant Date: 20210823

## 2021-09-10 NOTE — Progress Notes (Signed)
[facility] Summary Report / Loop Recorder 

## 2021-09-19 ENCOUNTER — Ambulatory Visit
Admission: RE | Admit: 2021-09-19 | Discharge: 2021-09-19 | Disposition: A | Discharge status: Home / Self Care | Payer: BC Managed Care – PPO | Source: Ambulatory Visit | Attending: Neurosurgery | Admitting: Neurosurgery

## 2021-09-19 ENCOUNTER — Other Ambulatory Visit: Payer: Self-pay | Admitting: Neurosurgery

## 2021-09-19 ENCOUNTER — Ambulatory Visit
Admission: RE | Admit: 2021-09-19 | Discharge: 2021-09-19 | Disposition: A | Discharge status: Home / Self Care | Payer: Self-pay | Source: Ambulatory Visit | Attending: Neurosurgery | Admitting: Neurosurgery

## 2021-09-19 ENCOUNTER — Other Ambulatory Visit: Payer: Self-pay

## 2021-09-19 DIAGNOSIS — R52 Pain, unspecified: Secondary | ICD-10-CM

## 2021-09-19 DIAGNOSIS — D320 Benign neoplasm of cerebral meninges: Secondary | ICD-10-CM

## 2021-09-19 MED ORDER — GADOBENATE DIMEGLUMINE 529 MG/ML IV SOLN
13.0000 mL | Freq: Once | INTRAVENOUS | Status: AC | PRN
  Administered 2021-09-19: 13 mL via INTRAVENOUS

## 2021-10-06 ENCOUNTER — Ambulatory Visit (INDEPENDENT_AMBULATORY_CARE_PROVIDER_SITE_OTHER): Payer: BC Managed Care – PPO

## 2021-10-06 DIAGNOSIS — R55 Syncope and collapse: Secondary | ICD-10-CM

## 2021-10-07 LAB — CUP PACEART REMOTE DEVICE CHECK
Date Time Interrogation Session: 20230205230333
Implantable Pulse Generator Implant Date: 20210823

## 2021-10-08 ENCOUNTER — Encounter: Payer: Self-pay | Admitting: Surgery

## 2021-10-08 ENCOUNTER — Other Ambulatory Visit: Payer: Self-pay

## 2021-10-08 ENCOUNTER — Ambulatory Visit: Payer: BC Managed Care – PPO | Admitting: Surgery

## 2021-10-08 VITALS — BP 135/83 | HR 61 | Ht 65.0 in | Wt 165.6 lb

## 2021-10-08 DIAGNOSIS — M1611 Unilateral primary osteoarthritis, right hip: Secondary | ICD-10-CM | POA: Diagnosis not present

## 2021-10-08 DIAGNOSIS — M533 Sacrococcygeal disorders, not elsewhere classified: Secondary | ICD-10-CM

## 2021-10-08 DIAGNOSIS — M5416 Radiculopathy, lumbar region: Secondary | ICD-10-CM

## 2021-10-08 DIAGNOSIS — G8929 Other chronic pain: Secondary | ICD-10-CM | POA: Diagnosis not present

## 2021-10-08 NOTE — Progress Notes (Signed)
[facility] Summary Report / Loop Recorder 

## 2021-10-08 NOTE — Progress Notes (Signed)
Office Visit Note   Patient: Mercedes Beltran           Date of Birth: [birth year]           MRN: [id number] Visit Date: 10/08/2021              Requested by: Imagene Riches, NP [address],  [place] [address] PCP: Imagene Riches, NP   Assessment & Plan: Visit Diagnoses:  1. Chronic right SI joint pain   2. Radiculopathy, lumbar region   3. Arthritis of right hip     Plan: The patient's ongoing low back pain and right lower extremity radicular issues I will go ahead and schedule lumbar MRI that was previously discussed with Dr. Inda Merlin several years ago.  Follow-up with him in 3 weeks for recheck to review the study and discuss further treatment options.  Patient will also start formal PT has previously ordered by her PCP.  On exam patient did have tenderness over the right SI joint with some question of swelling.  I ordered CBC and arthritis panel and this was drawn in the clinic today.  Dr. Lorin Mercy will review those lab results when she returns.  May consider diagnostic/therapeutic right SI joint injection in the future.  Advised patient at this time that I cannot fill out paperwork that she has for her job stating that she is cleared to perform all duties that are required with her job description.  States that she we will see if her primary care Heide Scales will fill this out.  Follow-Up Instructions: Return in about 3 weeks (around 10/29/2021) for WITH DR YATES TO REVIEW LUMBAR MRI AND LABS.   Orders:  Orders Placed This Encounter  Procedures   MR LUMBAR SPINE WO CONTRAST   CBC   Antinuclear Antib (ANA)   Uric acid   Rheumatoid Factor   Sed Rate (ESR)   No orders of the defined types were placed in this encounter.     Procedures: No procedures performed   Clinical Data: No additional findings.   Subjective: Chief Complaint  Patient presents with   Lower Back - Pain   Right Hip - Pain    HPI 56 year old white female with known history of low back pain and lower  extremity radiculopathy and right hip pain returns.  She was last seen by Dr. Junius Roads July 2021 and Dr. Lorin Mercy October 2018 for these complaints.  Patient put as a Designer, industrial/product and has continued to have ongoing issues with right-sided low back pain that radiates into the right buttock and right anterior hip.  She feels like she has some swelling around her right SI joint.  She takes over-the-counter Advil and also Zanaflex prescribed by her primary care provider.  States that her PCP has also ordered formal PT but she has not scheduled appointment yet. Review of Systems No current cardiopulmonary GI/GU issues  Objective: Vital Signs: BP 135/83    Pulse 61    Ht 5' 5" (1.651 m)    Wt 165 lb 9.6 oz (75.1 kg)    BMI 27.56 kg/m   Physical Exam HENT:     Head: Normocephalic.  Eyes:     Extraocular Movements: Extraocular movements intact.  Pulmonary:     Effort: No respiratory distress.  Musculoskeletal:     Comments: Gait is normal.  Right-sided lumbar paraspinal tenderness.  Moderate to marked right SI joint tenderness.  Negative on the left side.  Negative lower bilateral hips.  Mild discomfort with right straight leg raise.  No focal motor deficits.  Neurological:     Mental Status: She is alert.    Ortho Exam  Specialty Comments:  No specialty comments available.  Imaging: No results found.   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   Abnormal MRI, pelvis    Iron deficiency anemia    Past Medical History:  Diagnosis Date   Abnormal MRI, pelvis    Bradycardia    COPD (chronic obstructive pulmonary disease) ([facility])    DOE (dyspnea on exertion)  05/14/2017   Fatty liver    GERD (gastroesophageal reflux disease)    Headache    History of hiatal hernia    Iron deficiency anemia    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   Shortness of breath dyspnea    since 08/2014    Family History  Problem Relation Age of Onset   Breast cancer Paternal Aunt        x6, not sure of ages of onset   Cancer Mother        colon, liver,lung   Heart disease Maternal Grandmother    Cancer Maternal Grandfather     Past Surgical History:  Procedure Laterality Date   ABDOMINAL HYSTERECTOMY     CHOLECYSTECTOMY     Social History   Occupational History   Not on file  Tobacco Use   Smoking status: Former    Types: Cigarettes    Quit date: 02/20/2011    Years since quitting: 10.6   Smokeless tobacco: Never   Tobacco comments:    quit 01/2011  Substance and Sexual Activity   Alcohol use: No    Alcohol/week: 0.0 standard drinks   Drug use: Not on file   Sexual activity: Yes

## 2021-10-10 LAB — CBC
HCT: 46.9 % — ABNORMAL HIGH (ref 35.0–45.0)
Hemoglobin: 15.8 g/dL — ABNORMAL HIGH (ref 11.7–15.5)
MCH: 30.9 pg (ref 27.0–33.0)
MCHC: 33.7 g/dL (ref 32.0–36.0)
MCV: 91.8 fL (ref 80.0–100.0)
MPV: 11.4 fL (ref 7.5–12.5)
Platelets: 290 10*3/uL (ref 140–400)
RBC: 5.11 10*6/uL — ABNORMAL HIGH (ref 3.80–5.10)
RDW: 12.9 % (ref 11.0–15.0)
WBC: 7.4 10*3/uL (ref 3.8–10.8)

## 2021-10-10 LAB — SEDIMENTATION RATE: Sed Rate: 2 mm/h (ref 0–30)

## 2021-10-10 LAB — URIC ACID: Uric Acid, Serum: 4.6 mg/dL (ref 2.5–7.0)

## 2021-10-10 LAB — ANA: Anti Nuclear Antibody (ANA): NEGATIVE

## 2021-10-10 LAB — RHEUMATOID FACTOR: Rheumatoid fact SerPl-aCnc: <14 IU/mL (ref <14)

## 2021-10-31 ENCOUNTER — Ambulatory Visit: Payer: BC Managed Care – PPO | Admitting: Orthopaedic Surgery

## 2021-10-31 ENCOUNTER — Other Ambulatory Visit: Payer: Self-pay

## 2021-10-31 ENCOUNTER — Ambulatory Visit
Admission: RE | Admit: 2021-10-31 | Discharge: 2021-10-31 | Disposition: A | Discharge status: Home / Self Care | Payer: BC Managed Care – PPO | Source: Ambulatory Visit | Attending: Surgery | Admitting: Surgery

## 2021-10-31 ENCOUNTER — Encounter: Payer: Self-pay | Admitting: Hematology & Oncology

## 2021-10-31 DIAGNOSIS — M5416 Radiculopathy, lumbar region: Secondary | ICD-10-CM

## 2021-11-10 ENCOUNTER — Ambulatory Visit (INDEPENDENT_AMBULATORY_CARE_PROVIDER_SITE_OTHER): Payer: BC Managed Care – PPO

## 2021-11-10 DIAGNOSIS — R55 Syncope and collapse: Secondary | ICD-10-CM

## 2021-11-11 LAB — CUP PACEART REMOTE DEVICE CHECK
Date Time Interrogation Session: 20230312231208
Implantable Pulse Generator Implant Date: 20210823

## 2021-11-25 ENCOUNTER — Encounter: Payer: Self-pay | Admitting: Orthopaedic Surgery

## 2021-11-25 ENCOUNTER — Ambulatory Visit: Payer: BC Managed Care – PPO | Admitting: Orthopaedic Surgery

## 2021-11-25 ENCOUNTER — Other Ambulatory Visit: Payer: Self-pay

## 2021-11-25 DIAGNOSIS — M7061 Trochanteric bursitis, right hip: Secondary | ICD-10-CM | POA: Diagnosis not present

## 2021-11-25 DIAGNOSIS — M5459 Other low back pain: Secondary | ICD-10-CM

## 2021-11-25 NOTE — Progress Notes (Signed)
? ?Office Visit Note ?  ?Patient: Mercedes Beltran           ?Date of Birth: 1965-09-02           ?MRN: [id number] ?Visit Date: 11/25/2021 ?             ?Requested by: Imagene Riches, NP ?[address] ?[place],  [place] [address] ?PCP: Imagene Riches, NP ? ? ?Assessment & Plan: ?Visit Diagnoses:  ?1. Trochanteric bursitis, right hip   ? ? ?Plan: Trochanteric injection performed which she tolerated well.  Improvement in symptoms postinjection.  We discussed this is likely secondary to the lumbar spine and we reviewed MRI which showed no significant areas of compression.  She can follow-up if she has persistent symptoms. ? ?Follow-Up Instructions: Return if symptoms worsen or fail to improve.  ? ?Orders:  ?No orders of the defined types were placed in this encounter. ? ?No orders of the defined types were placed in this encounter. ? ? ? ? Procedures: ?Large Joint Inj: R greater trochanter on 11/28/2021 10:05 AM ?Details: lateral approach ?Medications: 1 mL lidocaine 1 %; 2 mL bupivacaine 0.25 %; 40 mg methylPREDNISolone acetate 40 MG/ML ? ? ? ? ?Clinical Data: ?No additional findings. ? ? ?Subjective: ?Chief Complaint  ?Patient presents with  ? Lower Back - Pain, Follow-up  ?  MRI review  ? ? ?HPI 56 year old female returns post MRI scan with persistent back pain and right lateral hip pain.  Patient has been going to therapy and noted increased range of motion but no change in her pain.  MRI scan 10/31/2021 showed mild degenerative changes in the lumbar spine.  Left adnexal cyst 3.8 cm previously seen was 3 cm and pelvic ultrasound was recommended and is pending. ? ?Review of Systems all other systems updated unchanged from 06/25/2017 office visit. ? ? ?Objective: ?Vital Signs: BP 132/82   Pulse 71   Ht '5\' 5"'$  (1.651 m)   Wt 152 lb (68.9 kg)   BMI 25.29 kg/m?  ? ?Physical Exam ?Constitutional:   ?   Appearance: She is well-developed.  ?HENT:  ?   Head: Normocephalic.  ?   Right Ear: External ear normal.  ?   Left Ear: External  ear normal. There is no impacted cerumen.  ?Eyes:  ?   Pupils: Pupils are equal, round, and reactive to light.  ?Neck:  ?   Thyroid: No thyromegaly.  ?   Trachea: No tracheal deviation.  ?Cardiovascular:  ?   Rate and Rhythm: Normal rate.  ?Pulmonary:  ?   Effort: Pulmonary effort is normal.  ?Abdominal:  ?   Palpations: Abdomen is soft.  ?Musculoskeletal:  ?   Cervical back: No rigidity.  ?Skin: ?   General: Skin is warm and dry.  ?Neurological:  ?   Mental Status: She is alert and oriented to person, place, and time.  ?Psychiatric:     ?   Behavior: Behavior normal.  ? ? ?Ortho Exam patient has tenderness over the right greater trochanter negative logroll the hips negative straight leg raising mild sciatic notch tenderness on the right negative on the left. ? ?Specialty Comments:  ?No specialty comments available. ? ?Imaging: ?Study Result ? ?Narrative & Impression  ?CLINICAL DATA:  Low back pain, symptoms persist with > 6 wks ?treatment. Lumbar radiculopathy, symptoms persist with > 6 wks ?treatment. ?  ?EXAM: ?MRI LUMBAR SPINE WITHOUT CONTRAST ?  ?TECHNIQUE: ?Multiplanar, multisequence MR imaging of the lumbar spine was ?performed. No  intravenous contrast was administered. ?  ?COMPARISON:  Radiographs January 17 20. ?  ?FINDINGS: ?Segmentation:  Standard. ?  ?Alignment:  Physiologic. ?  ?Vertebrae:  No fracture, evidence of discitis, or bone lesion. ?  ?Conus medullaris and cauda equina: Conus extends to the L1 level. ?Conus and cauda equina appear normal. ?  ?Paraspinal and other soft tissues: A 3.8 cm left adnexal cystic ?lesion. Retroaortic left renal vein. ?  ?Disc levels: ?  ?T12-L1:No spinal canal or neural foraminal stenosis. ?  ?L1-2:No spinal canal or neural foraminal stenosis. ?  ?L2-3:No spinal canal or neural foraminal stenosis. ?  ?L3-4:Mild facet degenerative changes.No spinal canal or neural ?foraminal stenosis. ?  ?L4-5:Moderate facet degenerative changes.No spinal canal or neural ?foraminal  stenosis. ?  ?L5-S1:Mild facet degenerative changes.No spinal canal or neural ?foraminal stenosis. ?  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 ?  ?IMPRESSION: ?1. Mild degenerative changes of the lumbar spine without high-grade ?spinal canal or neural foraminal stenosis at any level. ?2. A 3.8 cm left adnexal simple-appearing cyst, not adequately ?characterized. Recommend prompt follow-up with pelvic US. Reference: ?JACR 2020 Feb;17(2):248-254 ?  ?  ?Electronically Signed ?  By: Pedro Earls M.D. ?  On: 10/31/2021 09:19 ?   ? ? ? ?PMFS History: ?Patient Active Problem List  ? Diagnosis Date Noted  ? Trochanteric bursitis, right hip 11/28/2021  ? Syncope 04/22/2020  ? SVT (supraventricular tachycardia) ([place]) 04/22/2020  ? Sinus node dysfunction ([place]) 04/22/2020  ? COPD (chronic obstructive pulmonary disease) ([place]) 11/06/2019  ? DOE (dyspnea on exertion) 05/14/2017  ? Low back pain radiating to right leg 04/09/2017  ? 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 Abnormal MRI, pelvis   ? Iron deficiency anemia   ? ?Past Medical History:  ?Diagnosis Date  ? Abnormal MRI, pelvis   ? Bradycardia   ? COPD (chronic obstructive pulmonary disease) ([place])   ? DOE (dyspnea on exertion) 05/14/2017  ? Fatty liver   ? GERD (gastroesophageal reflux disease)   ? Headache   ? History of hiatal hernia   ? Iron deficiency anemia   ? 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 Shortness of breath dyspnea   ? since 08/2014  ?  ?Family History  ?Problem Relation Age of Onset  ? Breast cancer Paternal Aunt   ?     x6, not sure of ages of onset  ? Cancer Mother   ?     colon, liver,lung  ? Heart disease Maternal Grandmother   ? Cancer Maternal Grandfather   ?  ?Past Surgical History:  ?Procedure Laterality Date  ? ABDOMINAL HYSTERECTOMY    ? CHOLECYSTECTOMY    ? ?Social History  ? ?Occupational History  ? Not on file  ?Tobacco Use  ? Smoking status: Former  ?  Types: Cigarettes  ?  Quit date: 02/20/2011  ?  Years since quitting: 10.7  ? Smokeless tobacco:  Never  ? Tobacco comments:  ?  quit 01/2011  ?Substance and Sexual Activity  ? Alcohol use: No  ?  Alcohol/week: 0.0 standard drinks  ? Drug use: Not on file  ? Sexual activity: Yes  ? ? ? ? ? ? ?

## 2021-11-25 NOTE — Progress Notes (Signed)
[facility] Summary Report / Loop Recorder 

## 2021-11-28 DIAGNOSIS — M7061 Trochanteric bursitis, right hip: Secondary | ICD-10-CM

## 2021-11-28 MED ORDER — BUPIVACAINE HCL 0.25 % IJ SOLN
2.0000 mL | INTRAMUSCULAR | Status: AC | PRN
  Administered 2021-11-28: 2 mL via INTRA_ARTICULAR

## 2021-11-28 MED ORDER — LIDOCAINE HCL 1 % IJ SOLN
1.0000 mL | INTRAMUSCULAR | Status: AC | PRN
  Administered 2021-11-28: 1 mL

## 2021-11-28 MED ORDER — METHYLPREDNISOLONE ACETATE 40 MG/ML IJ SUSP
40.0000 mg | INTRAMUSCULAR | Status: AC | PRN
  Administered 2021-11-28: 40 mg via INTRA_ARTICULAR

## 2021-12-09 ENCOUNTER — Encounter: Payer: Self-pay | Admitting: Orthopaedic Surgery

## 2021-12-09 ENCOUNTER — Ambulatory Visit: Payer: BC Managed Care – PPO | Admitting: Orthopaedic Surgery

## 2021-12-09 VITALS — BP 136/90 | HR 73 | Ht 65.0 in | Wt 152.0 lb

## 2021-12-09 DIAGNOSIS — M1611 Unilateral primary osteoarthritis, right hip: Secondary | ICD-10-CM

## 2021-12-14 NOTE — Progress Notes (Signed)
? ?Office Visit Note ?  ?Patient: Mercedes Beltran           ?Date of Birth: 1965-10-17           ?MRN: [id number] ?Visit Date: 12/09/2021 ?             ?Requested by: Imagene Riches, NP ?[address],  [place] [address] ?PCP: Imagene Riches, NP ? ? ?Assessment & Plan: ?Visit Diagnoses:  ?1. Arthritis of right hip   ? ? ?Plan: Patient with persistent pain in her hip difficulty walking.  She may have some tendinopathy about the hip versus more significant arthritis seen on plain radiographs.  MRI scan right hip ordered we will see her back after the scan for review.  She will use a cane in her opposite left hand. ? ?Follow-Up Instructions: No follow-ups on file.  ? ?Orders:  ?Orders Placed This Encounter  ?Procedures  ? MR Hip Right w/o contrast  ? ?No orders of the defined types were placed in this encounter. ? ? ? ? Procedures: ?No procedures performed ? ? ?Clinical Data: ?No additional findings. ? ? ?Subjective: ?Chief Complaint  ?Patient presents with  ? Right Hip - Pain, Follow-up  ? ? ?HPI 56 year old female returns post right trochanteric injection on 11/25/2021.  Patient states she has not gotten any relief and continues to have pain about the hip joint with difficulty walking.  Initially postinjection in the office on 11/25/2021 she was ambulating better.  She denies numbness tingling problems in her feet.  MRI scan showed some mild degenerative changes in her back done 10/31/2021.  No fever chills no bowel bladder associated symptoms.  She has had negative rheumatologic lab work-up. ? ?Review of Systems all the systems update unchanged from my previous office visit other than as updated above. ? ? ?Objective: ?Vital Signs: BP 136/90   Pulse 73   Ht '5\' 5"'$  (1.651 m)   Wt 152 lb (68.9 kg)   BMI 25.29 kg/m?  ? ?Physical Exam ?Constitutional:   ?   Appearance: She is well-developed.  ?HENT:  ?   Head: Normocephalic.  ?   Right Ear: External ear normal.  ?   Left Ear: External ear normal. There is no impacted  cerumen.  ?Eyes:  ?   Pupils: Pupils are equal, round, and reactive to light.  ?Neck:  ?   Thyroid: No thyromegaly.  ?   Trachea: No tracheal deviation.  ?Cardiovascular:  ?   Rate and Rhythm: Normal rate.  ?Pulmonary:  ?   Effort: Pulmonary effort is normal.  ?Abdominal:  ?   Palpations: Abdomen is soft.  ?Musculoskeletal:  ?   Cervical back: No rigidity.  ?Skin: ?   General: Skin is warm and dry.  ?Neurological:  ?   Mental Status: She is alert and oriented to person, place, and time.  ?Psychiatric:     ?   Behavior: Behavior normal.  ? ? ?Ortho Exam patient has pain with internal/external rotation of her hip.  Negative Trendelenburg gait.  Negative for straight leg raising.  Knee and ankle jerk are intact. ? ?Specialty Comments:  ?No specialty comments available. ? ?Imaging: ?No results found. ? ? ?PMFS History: ?Patient Active Problem List  ? Diagnosis Date Noted  ? Trochanteric bursitis, right hip 11/28/2021  ? Syncope 04/22/2020  ? SVT (supraventricular tachycardia) ([place]) 04/22/2020  ? Sinus node dysfunction ([place]) 04/22/2020  ? COPD (chronic obstructive pulmonary disease) ([place]) 11/06/2019  ? DOE (dyspnea on  exertion) 05/14/2017  ? Low back pain radiating to right leg 04/09/2017  ? 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 Abnormal MRI, pelvis   ? Iron deficiency anemia   ? ?Past Medical History:  ?Diagnosis Date  ? Abnormal MRI, pelvis   ? Bradycardia   ? COPD (chronic obstructive pulmonary disease) ([place])   ? DOE (dyspnea on exertion) 05/14/2017  ? Fatty liver   ? GERD (gastroesophageal reflux disease)   ? Headache   ? History of hiatal hernia   ? Iron deficiency anemia   ? 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 Shortness of breath dyspnea   ? since 08/2014  ?  ?Family History  ?Problem Relation Age of Onset  ? Breast cancer Paternal Aunt   ?     x6, not sure of ages of onset  ? Cancer Mother   ?     colon, liver,lung  ? Heart disease Maternal Grandmother   ? Cancer Maternal Grandfather   ?  ?Past Surgical History:  ?Procedure  Laterality Date  ? ABDOMINAL HYSTERECTOMY    ? CHOLECYSTECTOMY    ? ?Social History  ? ?Occupational History  ? Not on file  ?Tobacco Use  ? Smoking status: Former  ?  Types: Cigarettes  ?  Quit date: 02/20/2011  ?  Years since quitting: 10.8  ? Smokeless tobacco: Never  ? Tobacco comments:  ?  quit 01/2011  ?Substance and Sexual Activity  ? Alcohol use: No  ?  Alcohol/week: 0.0 standard drinks  ? Drug use: Not on file  ? Sexual activity: Yes  ? ? ? ? ? ? ?

## 2021-12-15 ENCOUNTER — Other Ambulatory Visit: Payer: Self-pay | Admitting: Neurosurgery

## 2021-12-15 ENCOUNTER — Ambulatory Visit (INDEPENDENT_AMBULATORY_CARE_PROVIDER_SITE_OTHER): Payer: BC Managed Care – PPO

## 2021-12-15 DIAGNOSIS — R55 Syncope and collapse: Secondary | ICD-10-CM

## 2021-12-15 DIAGNOSIS — D320 Benign neoplasm of cerebral meninges: Secondary | ICD-10-CM

## 2021-12-17 LAB — CUP PACEART REMOTE DEVICE CHECK
Date Time Interrogation Session: 20230414230852
Implantable Pulse Generator Implant Date: 20210823

## 2022-01-01 NOTE — Progress Notes (Signed)
[facility] Summary Report / Loop Recorder 

## 2022-01-07 ENCOUNTER — Other Ambulatory Visit: Payer: BC Managed Care – PPO

## 2022-01-13 NOTE — Progress Notes (Signed)
Cardiology Office Note Date:  01/15/2022  Patient ID:  Mercedes Beltran, Mercedes Beltran [birth year], MRN [id number] PCP:  Imagene Riches, NP  Cardiologist:  Dr. Meda Coffee (previously) Electrophysiologist: Dr. Lovena Le    Chief Complaint:  annual visit  History of Present Illness: Mercedes Beltran is a 56 y.o. female with history of HLD, SVT  She saw Kathleen Argue, PA-C Jun 2021, she reported an episode of syncope a year prior , + prodrome, not recurrent.  Some concerns of bradycardia noted by her PMD, he reviewed her stress test with no evidence of chronotropic incompetence, was able to increase her heart rate from 52 to 151. Referred to EP for further evaluation of her SVT, syncope and baseline bradycardia  She comes in today to be seen for Dr. Lovena Le, last seen by him 04/22/2020, underwent loop implant to further evaluate her symptoms, rate/rhythm.  She saw A. Lynnell Jude, NP on 09/19/20, she was without symptoms, no recurrent syncope, ILR with no observations, provided with a symptom activator.  TODAY She is doing very well. 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, industrial/product, excellent exertrioinal capacity No CP, SOB, DOE Infrequent and momentary quick beats only No near syncope or syncope  Requests another symptom activator   Device information MDT LINQ II implanted 04/22/20, syncope  AAD Hx Flecainide 2021 >> stopped 2021 2/2 fatigue/bradycardia  Past Medical History:  Diagnosis Date   Abnormal MRI, pelvis    Bradycardia    COPD (chronic obstructive pulmonary disease) ([facility])    DOE (dyspnea on exertion) 05/14/2017   Fatty liver    GERD (gastroesophageal reflux disease)    Headache    History of hiatal hernia    Iron deficiency anemia    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     Biotin 1 MG CAPS Take 1 capsule by mouth daily.     cetirizine (ZYRTEC) 10 MG tablet Take 10 mg by mouth daily.     MULTIPLE VITAMIN PO Take 1 tablet by mouth daily.     nabumetone (RELAFEN) 750 MG tablet Take 1 tablet (750 mg total) by mouth 2 (two) times daily as needed. 60 tablet 6   nitroGLYCERIN (NITROSTAT) 0.4 MG SL tablet PLACE 1 TABLET (0.4 MG TOTAL) UNDER THE TONGUE EVERY 5 (FIVE) MINUTES AS NEEDED FOR CHEST PAIN. 25 tablet 3   RABEprazole (ACIPHEX) 20 MG tablet TAKE 2 CAPSULES BY MOUTH ONCE A DAY BEFORE MEALS     tiZANidine (ZANAFLEX) 2 MG tablet Take 1-2 tablets (2-4 mg total) by mouth every 6 (six) hours as needed for muscle spasms. 60 tablet 1   No current facility-administered medications for this visit.    Allergies:   Demerol  [meperidine hcl], Codeine, Demerol [meperidine], and Morphine and related   Social History:  The patient  reports that she quit smoking about 10 years ago. Her smoking use included cigarettes. She has never used smokeless tobacco. She reports that she does not drink alcohol.   Family History:  The patient's family history includes Breast cancer in her paternal aunt; Cancer in her maternal grandfather and mother; Heart disease in her maternal grandmother.  ROS:  Please see the history of present illness.  All other systems are reviewed and otherwise negative.   PHYSICAL EXAM:  VS:  BP 102/72   Pulse (!) 58   Ht '5\' 5"'$  (1.651 m)   Wt 159 lb (72.1 kg)   SpO2 96%   BMI 26.46 kg/m  BMI: Body mass index is 26.46 kg/m. Well nourished, well developed, in no acute distress HEENT: normocephalic, atraumatic Neck: no JVD, carotid bruits or masses Cardiac:  RRR; no significant murmurs, no rubs, or gallops Lungs:  CTA b/l, no wheezing, rhonchi or rales Abd: soft, nontender MS: no deformity or  atrophy Ext: o edema Skin: warm and dry, no rash Neuro:  No gross deficits appreciated Psych: euthymic  mood, full affect  ILR site is stable, no tethering or discomfort   EKG:  Done today and reviewed by myself shows  SB 54npm  Device interrogation done today and reviewed by myself:  Batter is good R waves 0.49 No observations  ETT 11/21/2019 Low risk, no ischemia   Cardiac Monitor 08/2019 Sinus bradycardia to sinus tachycardia. Five episodes of very short SVTs, the longest lasting 18 beats. No atrial fibrillation. Symptoms don't correlate with arrhythmias.   Echocardiogram 06/30/2019 EF 60-65, no RWMA, normal RVSF   Coronary CTA 10/19/2019 Ca score = 0 No evidence of CAD   Recent Labs: 10/08/2021: Hemoglobin 15.8; Platelets 290  No results found for requested labs within last 8760 hours.   CrCl cannot be calculated (Patient's most recent lab result is older than the maximum 21 days allowed.).   Wt Readings from Last 3 Encounters:  01/15/22 159 lb (72.1 kg)  12/09/21 152 lb (68.9 kg)  11/25/21 152 lb (68.9 kg)     Other studies reviewed: Additional studies/records reviewed today include: summarized above  ASSESSMENT AND PLAN:  Syncope SVT Baseline bradycardia No symptoms of bradycardia No SVTs Provided with a new symptom activator today   Disposition: F/u with remotes as usual, in clinic in ayear, sooner if needed.  Current medicines are reviewed at length with the patient today.  The patient did not have any concerns regarding medicines.  Venetia Night, PA-C 01/15/2022 9:03 AM     [facility] [place] [address] [phone] (office)  [phone] (fax)

## 2022-01-15 ENCOUNTER — Ambulatory Visit
Admission: RE | Admit: 2022-01-15 | Discharge: 2022-01-15 | Disposition: A | Discharge status: Home / Self Care | Payer: BC Managed Care – PPO | Source: Ambulatory Visit | Attending: Orthopaedic Surgery | Admitting: Orthopaedic Surgery

## 2022-01-15 ENCOUNTER — Encounter: Payer: Self-pay | Admitting: Physician Assistant

## 2022-01-15 ENCOUNTER — Ambulatory Visit: Payer: BC Managed Care – PPO | Admitting: Physician Assistant

## 2022-01-15 ENCOUNTER — Ambulatory Visit
Admission: RE | Admit: 2022-01-15 | Discharge: 2022-01-15 | Disposition: A | Discharge status: Home / Self Care | Payer: BC Managed Care – PPO | Source: Ambulatory Visit | Attending: Neurosurgery | Admitting: Neurosurgery

## 2022-01-15 VITALS — BP 102/72 | HR 58 | Ht 65.0 in | Wt 159.0 lb

## 2022-01-15 DIAGNOSIS — D320 Benign neoplasm of cerebral meninges: Secondary | ICD-10-CM

## 2022-01-15 DIAGNOSIS — R55 Syncope and collapse: Secondary | ICD-10-CM | POA: Diagnosis not present

## 2022-01-15 DIAGNOSIS — I495 Sick sinus syndrome: Secondary | ICD-10-CM

## 2022-01-15 DIAGNOSIS — M1611 Unilateral primary osteoarthritis, right hip: Secondary | ICD-10-CM

## 2022-01-15 DIAGNOSIS — Z4509 Encounter for adjustment and management of other cardiac device: Secondary | ICD-10-CM

## 2022-01-15 DIAGNOSIS — I471 Supraventricular tachycardia: Secondary | ICD-10-CM | POA: Diagnosis not present

## 2022-01-15 LAB — CUP PACEART INCLINIC DEVICE CHECK
Date Time Interrogation Session: 20230518100054
Implantable Pulse Generator Implant Date: 20210823

## 2022-01-15 MED ORDER — GADOBENATE DIMEGLUMINE 529 MG/ML IV SOLN
14.0000 mL | Freq: Once | INTRAVENOUS | Status: AC | PRN
  Administered 2022-01-15: 14 mL via INTRAVENOUS

## 2022-01-15 NOTE — Patient Instructions (Signed)
Medication Instructions:   Your physician recommends that you continue on your current medications as directed. Please refer to the Current Medication list given to you today.  *If you need a refill on your cardiac medications before your next appointment, please call your pharmacy*   Lab Work: [place]   If you have labs (blood work) drawn today and your tests are completely normal, you will receive your results only by: [place] (if you have MyChart) OR A paper copy in the mail If you have any lab test that is abnormal or we need to change your treatment, we will call you to review the results.   Testing/Procedures: NONE ORDERED  TODAY    Follow-Up: At [facility], you and your health needs are our priority.  As part of our continuing mission to provide you with exceptional heart care, we have created designated Provider Care Teams.  These Care Teams include your primary Cardiologist (physician) and Advanced Practice Providers (APPs -  Physician Assistants and Nurse Practitioners) who all work together to provide you with the care you need, when you need it.  We recommend signing up for the patient portal called "MyChart".  Sign up information is provided on this After Visit Summary.  MyChart is used to connect with patients for Virtual Visits (Telemedicine).  Patients are able to view lab/test results, encounter notes, upcoming appointments, etc.  Non-urgent messages can be sent to your provider as well.   To learn more about what you can do with MyChart, go to [email or web address].    Your next appointment:   1 year(s)  The format for your next appointment:   In Person  Provider:   You may see Dr.Taylor  or one of the following Advanced Practice Providers on your designated Care Team:   Tommye Standard, [place]   }   Other Instructions   Important Information About Sugar

## 2022-01-19 ENCOUNTER — Ambulatory Visit (INDEPENDENT_AMBULATORY_CARE_PROVIDER_SITE_OTHER): Payer: BC Managed Care – PPO

## 2022-01-19 DIAGNOSIS — R55 Syncope and collapse: Secondary | ICD-10-CM | POA: Diagnosis not present

## 2022-01-20 ENCOUNTER — Encounter: Payer: Self-pay | Admitting: Orthopaedic Surgery

## 2022-01-20 ENCOUNTER — Ambulatory Visit: Payer: BC Managed Care – PPO | Admitting: Orthopaedic Surgery

## 2022-01-20 DIAGNOSIS — M25551 Pain in right hip: Secondary | ICD-10-CM

## 2022-01-21 DIAGNOSIS — M25551 Pain in right hip: Secondary | ICD-10-CM | POA: Insufficient documentation

## 2022-01-21 LAB — CUP PACEART REMOTE DEVICE CHECK
Date Time Interrogation Session: 20230524094815
Implantable Pulse Generator Implant Date: 20210823

## 2022-01-21 NOTE — Progress Notes (Signed)
Office Visit Note   Patient: Mercedes Beltran           Date of Birth: 11/07/1965           MRN: [id number] Visit Date: 01/20/2022              Requested by: Imagene Riches, NP [address],  [place] [address] PCP: Imagene Riches, NP   Assessment & Plan: Visit Diagnoses:  1. Pain in right hip     Plan: Patient has no evidence of muscle injury or tendinopathy about the hip .Hip joints normal.  She has tried different anti-inflammatories.  I reviewed MRI report as well as lumbar MRI and gave her copies report. To stress fracture or tendon problem about the hip.  Follow-Up Instructions: No follow-ups on file.   Orders:  No orders of the defined types were placed in this encounter.  No orders of the defined types were placed in this encounter.     Procedures: No procedures performed   Clinical Data: No additional findings.   Subjective: Chief Complaint  Patient presents with   Right Hip - Pain, Follow-up    MRI review    HPI 56 year old female returns with ongoing chronic pain about the hip.  Its inferior to her belt line.  Around the trochanter.  Does not seem to change with position is constant does not seem to be particularly aggravated with weightbearing.  No associated bowel or bladder symptoms.  MRI of the right hip has been obtained and is available for review.  Patient had recent MRI of her brain for meningioma monitoring which appears stable.  Patient's had MRI scan of her hip which is available for review.  Incidental findings as a 3.2 cm left ovarian cyst unchanged from 2016 and stable which is on the opposite side of her hip pain.  Some diverticulosis is noted on her scan.  No history of trauma.  She denies limping.  Review of Systems all other systems noncontributory to HPI.   Objective: Vital Signs: BP 110/74   Pulse 70   Ht '5\' 5"'$  (1.651 m)   Wt 159 lb (72.1 kg)   BMI 26.46 kg/m   Physical Exam Constitutional:      Appearance: She is  well-developed.  HENT:     Head: Normocephalic.     Right Ear: External ear normal.     Left Ear: External ear normal. There is no impacted cerumen.  Eyes:     Pupils: Pupils are equal, round, and reactive to light.  Neck:     Thyroid: No thyromegaly.     Trachea: No tracheal deviation.  Cardiovascular:     Rate and Rhythm: Normal rate.  Pulmonary:     Effort: Pulmonary effort is normal.  Abdominal:     Palpations: Abdomen is soft.  Musculoskeletal:     Cervical back: No rigidity.  Skin:    General: Skin is warm and dry.  Neurological:     Mental Status: She is alert and oriented to person, place, and time.  Psychiatric:        Behavior: Behavior normal.    Ortho Exam negative straight leg raising negative logroll.  Note negative popliteal compression test knee and ankle jerk are intact.  No sciatic notch tenderness.  Some tenderness over the gluteus medius.  Specialty Comments:  No specialty comments available.  Imaging: Narrative & Impression  CLINICAL DATA:  Chronic right hip pain, proximally 7 years. No known injury.  EXAM: MR OF THE RIGHT HIP WITHOUT CONTRAST   TECHNIQUE: Multiplanar, multisequence MR imaging was performed. No intravenous contrast was administered.   COMPARISON:  Plain films right hip 06/25/2017. MRI right hip 05/04/2015. Lumbar spine MRI 10/31/2021.   FINDINGS: Bones: No fracture, stress change or worrisome lesion is identified. No subchondral cyst formation or edema about the hips. No avascular necrosis of the femoral heads. 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   Articular cartilage and labrum   Articular cartilage:  Preserved.   Labrum:  Intact.   Joint or bursal effusion   Joint effusion:  None.   Bursae: Negative.   Muscles and tendons   Muscles and tendons:  Intact and normal in appearance.   Other findings   Miscellaneous: 3.2 cm left ovarian cyst is unchanged since 2016. No follow-up imaging  recommended. Note: This recommendation does not apply to premenarchal patients and to those with increased risk (genetic, family history, elevated tumor markers or other high-risk factors) of ovarian cancer. Reference: JACR 2020 Feb; 17(2):248-254 Sigmoid diverticulosis noted.   IMPRESSION: Normal-appearing right hip.   Tarlov cysts in the sacrum, greater to the right is seen on the patient's lumbar spine MRI noted.   Diverticulosis.     Electronically Signed   By: Inge Rise M.D.   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   Abnormal MRI, pelvis    Iron deficiency anemia    Past Medical History:  Diagnosis Date   Abnormal MRI, pelvis    Bradycardia    COPD (chronic obstructive pulmonary disease) ([facility])    DOE (dyspnea on exertion) 05/14/2017   Fatty liver    GERD (gastroesophageal reflux disease)    Headache    History of hiatal hernia    Iron deficiency anemia    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   Shortness of breath dyspnea    since 08/2014    Family History  Problem Relation Age of Onset   Breast cancer Paternal Aunt        x6, not sure of ages of onset   Cancer Mother        colon, liver,lung   Heart disease Maternal Grandmother    Cancer Maternal Grandfather     Past Surgical History:  Procedure Laterality Date   ABDOMINAL HYSTERECTOMY     CHOLECYSTECTOMY     Social History   Occupational History   Not on file  Tobacco Use   Smoking status: Former    Types: Cigarettes    Quit date: 02/20/2011    Years since quitting:  10.9   Smokeless tobacco: Never   Tobacco comments:    quit 01/2011  Substance and Sexual Activity   Alcohol use: No    Alcohol/week: 0.0 standard drinks   Drug use: Not on file   Sexual activity: Yes

## 2022-02-03 NOTE — Progress Notes (Signed)
[facility] Summary Report / Loop Recorder 

## 2022-02-23 ENCOUNTER — Ambulatory Visit (INDEPENDENT_AMBULATORY_CARE_PROVIDER_SITE_OTHER): Payer: BC Managed Care – PPO

## 2022-02-23 DIAGNOSIS — R55 Syncope and collapse: Secondary | ICD-10-CM | POA: Diagnosis not present

## 2022-02-25 LAB — CUP PACEART REMOTE DEVICE CHECK
Date Time Interrogation Session: 20230619231214
Implantable Pulse Generator Implant Date: 20210823

## 2022-03-16 ENCOUNTER — Ambulatory Visit: Payer: BC Managed Care – PPO | Admitting: Obstetrics & Gynecology

## 2022-03-16 ENCOUNTER — Encounter: Payer: Self-pay | Admitting: Obstetrics & Gynecology

## 2022-03-16 VITALS — BP 136/91 | HR 66 | Ht 65.0 in | Wt 163.0 lb

## 2022-03-16 DIAGNOSIS — Z1231 Encounter for screening mammogram for malignant neoplasm of breast: Secondary | ICD-10-CM

## 2022-03-16 DIAGNOSIS — N83202 Unspecified ovarian cyst, left side: Secondary | ICD-10-CM

## 2022-03-16 NOTE — Progress Notes (Signed)
GYNECOLOGY OFFICE VISIT NOTE  History:   Mercedes Beltran is a 56 y.o. PMP female with history of hysterectomy here today for discussion of management of chronic small left simple ovarian cyst. Has been seen on multiple imaging modalities, but had a pelvic ultrasound at [facility] in 2022 where she reports she was told her "ovaries disappeared".  It as seen again incidentally on MRI of hip done this May.  She reports off and on pain, but none currently, wants surgical intervention.  She denies any abnormal vaginal discharge, bleeding, pelvic pain or other concerns.    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   Health Maintenance:  No paps needed given history of hysterectomy for benign indications.  Normal mammogram on 02/07/2020  Review of Systems:  Pertinent items noted in HPI and remainder of comprehensive ROS otherwise negative.  Physical Exam:  BP (!) 136/91   Pulse 66   Ht '5\' 5"'$  (1.651 m)   Wt 163 lb (73.9 kg)   BMI 27.12 kg/m  CONSTITUTIONAL: Well-developed, well-nourished female in no acute distress.  HEENT:  Normocephalic, atraumatic. External right and left ear normal. No scleral icterus.  NECK: Normal range of motion, supple, no masses noted on observation SKIN: No rash noted.  Not diaphoretic. No erythema. No pallor. MUSCULOSKELETAL: Normal range of motion. No edema noted. NEUROLOGIC: Alert and oriented to person, place, and time. Normal muscle tone coordination. No cranial nerve deficit noted. PSYCHIATRIC: Normal mood and affect. Normal behavior. Normal judgment and thought content. CARDIOVASCULAR: Normal heart rate noted RESPIRATORY: Effort and breath sounds normal, no problems with respiration noted ABDOMEN: No masses noted. No other overt distention noted.   PELVIC: Deferred     Assessment and Plan:    1. Cyst of left ovary Discussed that left ovarian cyst is simple and has been stable in size since 2016. No intervention is recommended for small simple cysts, without concerning associated symptoms.  In 2022, she had a pelvic scan at [facility] in which her ovaries were not visualized, she was told this can happen because of obscuring bowel, the ovaries do not 'disappear'.  They can be picked up on other imaging modalities, like during her recent MR of hips. However, all imaging modalities show that the cyst is simple and remains small (~3 cm).   Given history of abdominal hysterectomy and diverticulosis, it is very likely that her postmenopausal ovaries and cyst are surrounded if not encased by bowel. Therefore, there is increased risk of bowel injury and associated sequelae (ostomy etc) if surgery is attempted.  Reassured patient that the cyst is benign, and that I do not recommend surgery. Patient did not seem happy with this explanation and left the office.  Routine preventative health maintenance measures emphasized, she will be called and offered screening mammogram.  Please refer to After Visit Summary for other counseling recommendations.   Return for any gynecologic concerns.    I spent 20 minutes dedicated to the care of this patient including pre-visit review of records, face to face time with the patient discussing her conditions and treatments and post visit  orders.    Verita Schneiders, MD, [place] for [employer], [place]

## 2022-03-16 NOTE — Progress Notes (Signed)
Pt states she will have occasional sharp pain and tenderness.  Pt states she has had Mammo.

## 2022-03-18 NOTE — Progress Notes (Signed)
[facility] Summary Report / Loop Recorder 

## 2022-03-26 LAB — CUP PACEART REMOTE DEVICE CHECK
Date Time Interrogation Session: 20230722230400
Implantable Pulse Generator Implant Date: 20210823

## 2022-03-30 ENCOUNTER — Ambulatory Visit (INDEPENDENT_AMBULATORY_CARE_PROVIDER_SITE_OTHER): Payer: BC Managed Care – PPO

## 2022-03-30 DIAGNOSIS — R55 Syncope and collapse: Secondary | ICD-10-CM | POA: Diagnosis not present

## 2022-05-03 NOTE — Progress Notes (Signed)
[facility] Summary Report / Loop Recorder 

## 2022-05-05 ENCOUNTER — Ambulatory Visit (INDEPENDENT_AMBULATORY_CARE_PROVIDER_SITE_OTHER): Payer: BC Managed Care – PPO

## 2022-05-05 DIAGNOSIS — R55 Syncope and collapse: Secondary | ICD-10-CM | POA: Diagnosis not present

## 2022-05-05 LAB — CUP PACEART REMOTE DEVICE CHECK
Date Time Interrogation Session: 20230905114528
Implantable Pulse Generator Implant Date: 20210823

## 2022-05-26 ENCOUNTER — Telehealth: Payer: Self-pay | Admitting: Physician Assistant

## 2022-05-26 NOTE — Telephone Encounter (Signed)
New Message:     Patient said he was told when he got his device that he could not go through a Metal Detector. He says he need a letter for work stating this. e

## 2022-05-26 NOTE — Telephone Encounter (Signed)
Patient called in and reports she needed a note from Dr. Lovena Le advising she could not go through a metal detector or metal detector wand while having and ILR in place. Patient states she had to file a "statement against another co-worker who has been harassing me various ways which included using a wand over her device after I told her not to."   Patient states during the implant visit she was told not to go through metal detectors or wands over device but not sure who said it. I do not see any notes on file that state that information.  Confirmed with [employer] and patient has no restrictions against metal detectors or wands. Advised patient of info and that I was not able to write a letter I will have to reach out to Dr. Lovena Le to see his recommendations. Patient updated and voiced understanding.

## 2022-05-28 NOTE — Telephone Encounter (Signed)
There is no metal detector or wand issue with the loop recorder. She may go through a Clinical biochemist. GT

## 2022-05-29 NOTE — Progress Notes (Signed)
[facility] Summary Report / Loop Recorder 

## 2022-06-01 NOTE — Telephone Encounter (Signed)
Attempted to call patient to follow up. No answer, LMTCB.

## 2022-06-02 ENCOUNTER — Telehealth: Payer: Self-pay | Admitting: Internal Medicine

## 2022-06-02 NOTE — Telephone Encounter (Signed)
I spoke with the patient and let her know that we did not have a phone note. I am not sure who called the patient or why.

## 2022-06-02 NOTE — Telephone Encounter (Signed)
Patient was returning call. She ask when calling back if she dont answer, please leave a detail message. Please advise

## 2022-06-03 LAB — CUP PACEART REMOTE DEVICE CHECK
Date Time Interrogation Session: 20231002231634
Implantable Pulse Generator Implant Date: 20210823

## 2022-06-03 NOTE — Telephone Encounter (Signed)
If patient calls back, please advise of Dr. Tanna Furry recommendations from note on 05/26/22.

## 2022-06-03 NOTE — Telephone Encounter (Signed)
Patient would like for me to send a message to Dr. Lovena Le to write a letter stating that "Patient misunderstood that she could walk through the metal detector at that time, but now she is aware she can." Advised pt I will forward and someone will f/u once we hear back. Pt voiced understanding.

## 2022-06-03 NOTE — Telephone Encounter (Signed)
Attempted to return call. No answer, LMTCB. 

## 2022-06-08 ENCOUNTER — Ambulatory Visit (INDEPENDENT_AMBULATORY_CARE_PROVIDER_SITE_OTHER): Payer: BC Managed Care – PPO

## 2022-06-08 DIAGNOSIS — R55 Syncope and collapse: Secondary | ICD-10-CM | POA: Diagnosis not present

## 2022-06-14 NOTE — Telephone Encounter (Signed)
Lets write this letter this week. GT

## 2022-06-15 NOTE — Telephone Encounter (Signed)
Pt called and advised per Dr. Tanna Furry request, that we will MyChart message a letter for patients employer.   Letter sent, and MyChart message sent, after follow up call with Pt.  Pt understood, and will show employer the letter.

## 2022-06-17 NOTE — Progress Notes (Signed)
[facility] Summary Report / Loop Recorder 

## 2022-06-21 IMAGING — MR MR LUMBAR SPINE W/O CM
4 of 5 series · 26 of 48 positions shown · non-contrast
Comparison: Radiographs September 16, 18.

CLINICAL DATA: Low back pain, symptoms persist with > 6 wks
treatment. Lumbar radiculopathy, symptoms persist with > 6 wks
treatment.

EXAM:
MRI LUMBAR SPINE WITHOUT CONTRAST
TECHNIQUE: Multiplanar, multisequence MR imaging of the lumbar spine was
performed. No intravenous contrast was administered.

[Series 4: T2 · sagittal · 4.0mm · 1.09mm/px · 6 of 14 slices shown (1 of 2)]
[im 1/14]
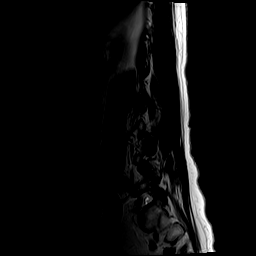
[im 3/14]
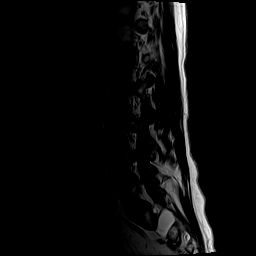
[im 6/14]
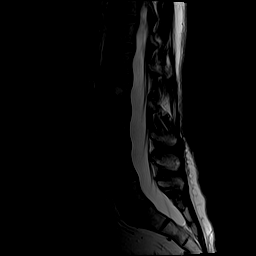
[im 8/14]
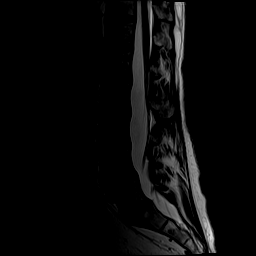
[im 11/14]
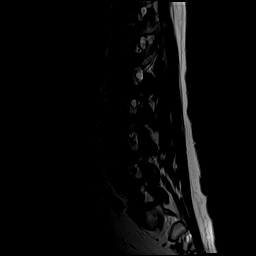
[im 14/14]
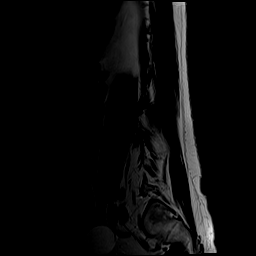

[Series 6: T1 · sagittal · 4.0mm · 1.09mm/px · 5 of 14 slices shown (1 of 2)]
[im 1/14]
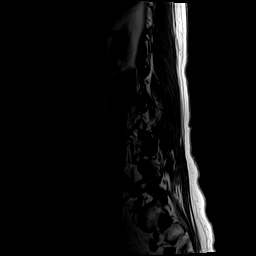
[im 4/14]
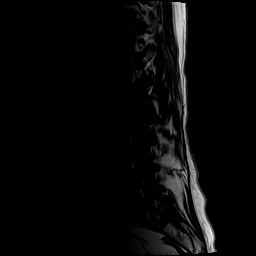
[im 7/14]
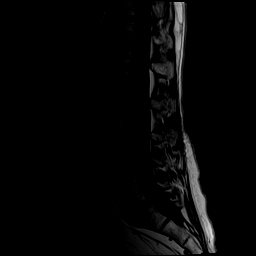
[im 10/14]
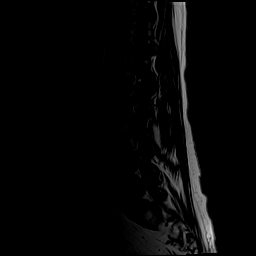
[im 14/14]
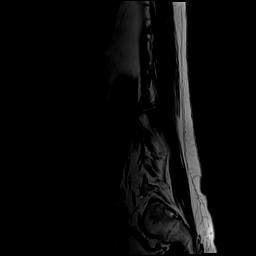

[Series 7: T2 · axial · 4.0mm · 0.39mm/px · z∈[-35,+182]mm · 10 of 44 slices shown (2 of 2)]
[im 3/44]
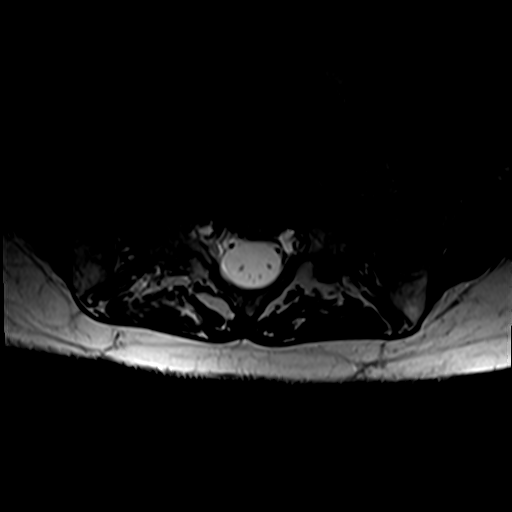
[im 6/44]
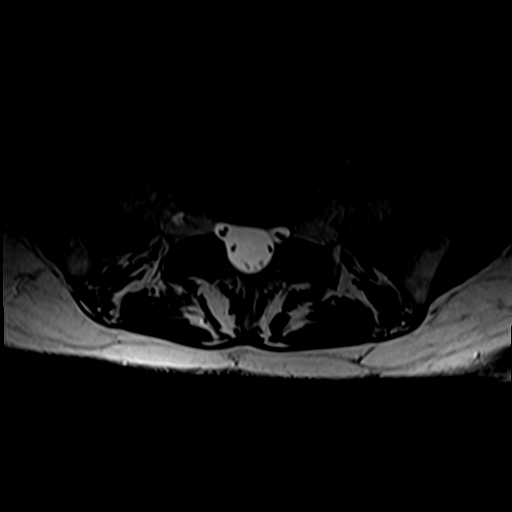
[im 9/44]
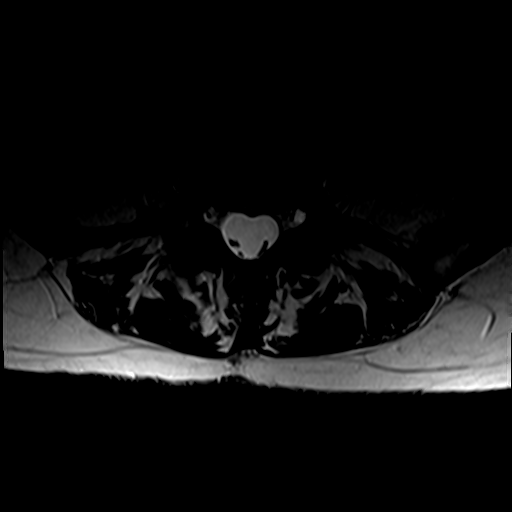
[im 15/44]
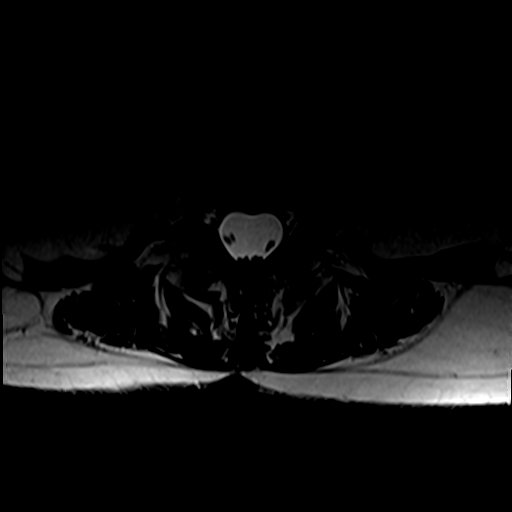
[im 21/44]
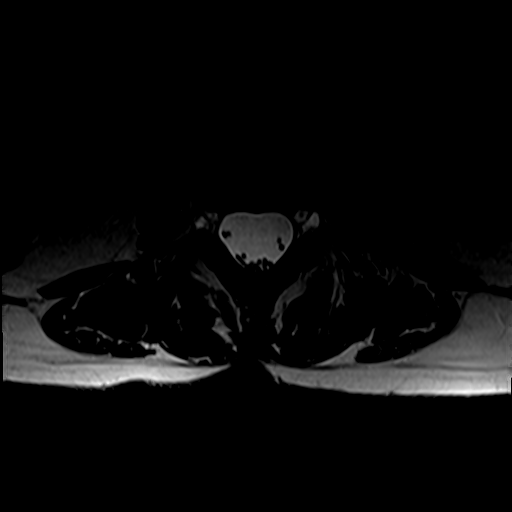
[im 23/44]
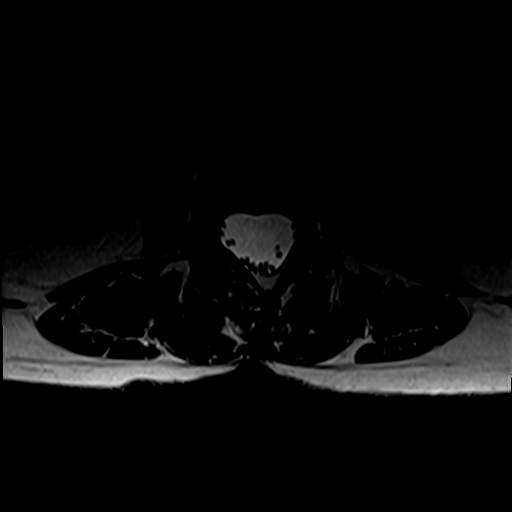
[im 26/44]
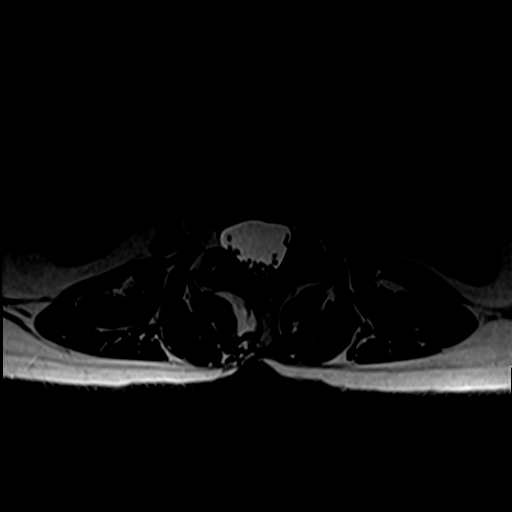
[im 32/44]
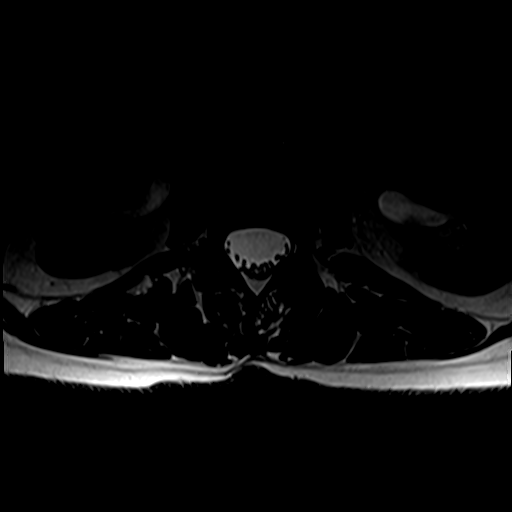
[im 38/44]
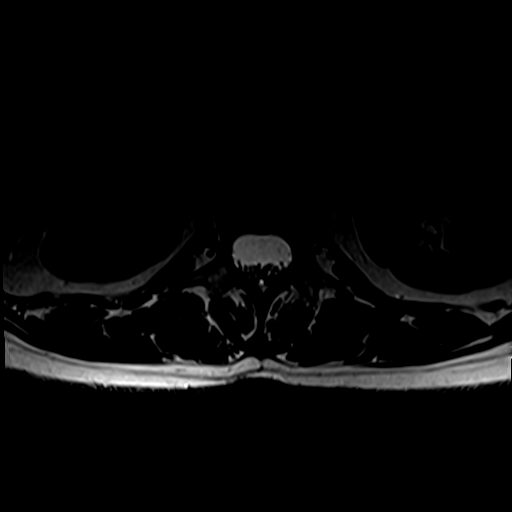
[im 44/44]
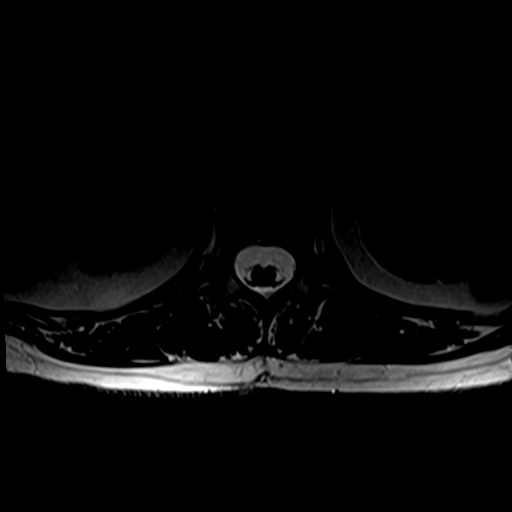

[Series 8: T1 · axial · 4.0mm · 0.39mm/px · z∈[-35,+153]mm · 5 of 44 slices shown (2 of 2)]
[im 3/44]
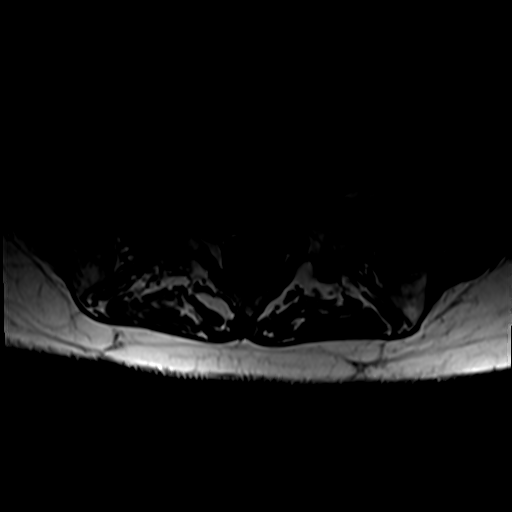
[im 6/44]
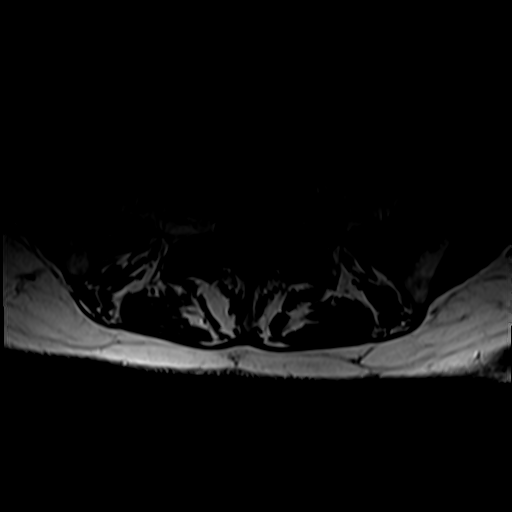
[im 9/44]
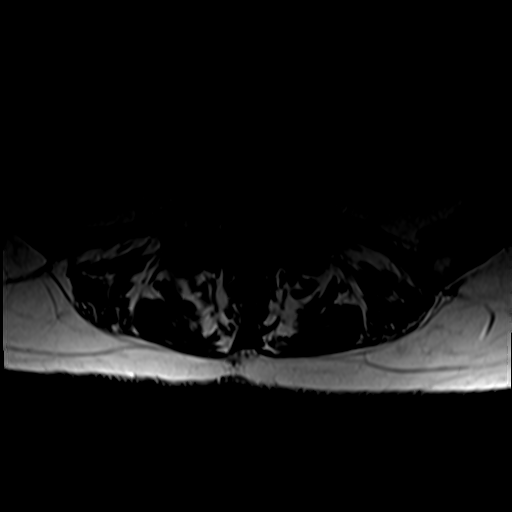
[im 23/44]
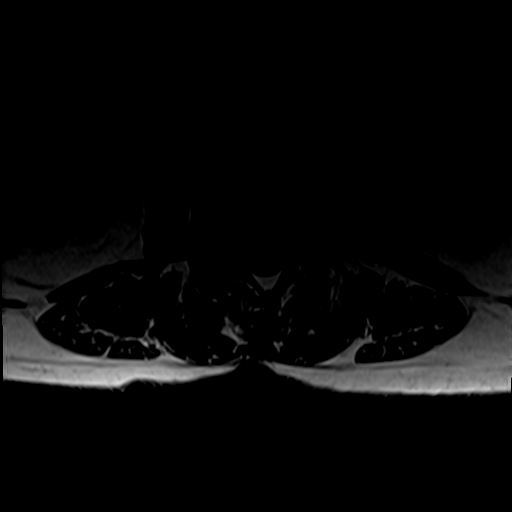
[im 38/44]
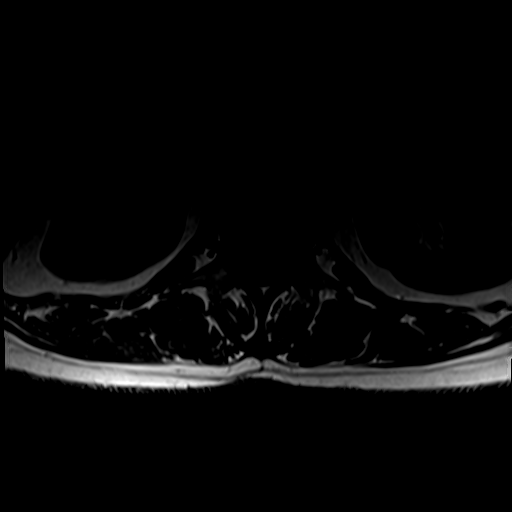

[26 of 48 positions shown; findings below may reference images not displayed]

FINDINGS: Segmentation:  Standard.

Alignment:  Physiologic.

Vertebrae:  No fracture, evidence of discitis, or bone lesion.

Conus medullaris and cauda equina: Conus extends to the L1 level.
Conus and cauda equina appear normal.

Paraspinal and other soft tissues: A 3.8 cm left adnexal cystic
lesion. Retroaortic left renal vein.

Disc levels:

T12-L1:No spinal canal or neural foraminal stenosis.

L1-2:No spinal canal or neural foraminal stenosis.

L2-3:No spinal canal or neural foraminal stenosis.

L3-4:Mild facet degenerative changes.No spinal canal or neural
foraminal stenosis.

L4-5:Moderate facet degenerative changes.No spinal canal or neural
foraminal stenosis.

L5-S1:Mild facet degenerative changes.No spinal canal or neural
foraminal stenosis.

S1-2: Right-sided Tarlov cyst.
IMPRESSION: 1. Mild degenerative changes of the lumbar spine without high-grade
spinal canal or neural foraminal stenosis at any level.
2. A 3.8 cm left adnexal simple-appearing cyst, not adequately
characterized. Recommend prompt follow-up with pelvic US. Reference:
JACR [DATE]):[id number]

## 2022-07-09 LAB — CUP PACEART REMOTE DEVICE CHECK
Date Time Interrogation Session: 20231104230915
Implantable Pulse Generator Implant Date: 20210823

## 2022-07-13 ENCOUNTER — Ambulatory Visit (INDEPENDENT_AMBULATORY_CARE_PROVIDER_SITE_OTHER): Payer: BC Managed Care – PPO

## 2022-07-13 DIAGNOSIS — R55 Syncope and collapse: Secondary | ICD-10-CM | POA: Diagnosis not present

## 2022-08-17 ENCOUNTER — Ambulatory Visit (INDEPENDENT_AMBULATORY_CARE_PROVIDER_SITE_OTHER): Payer: BC Managed Care – PPO

## 2022-08-17 DIAGNOSIS — R55 Syncope and collapse: Secondary | ICD-10-CM

## 2022-08-18 LAB — CUP PACEART REMOTE DEVICE CHECK
Date Time Interrogation Session: 20231217230425
Implantable Pulse Generator Implant Date: 20210823

## 2022-08-27 NOTE — Progress Notes (Signed)
[facility] Summary Report / Loop Recorder 

## 2022-09-21 ENCOUNTER — Ambulatory Visit: Payer: BC Managed Care – PPO | Attending: Internal Medicine

## 2022-09-21 DIAGNOSIS — R55 Syncope and collapse: Secondary | ICD-10-CM

## 2022-09-23 LAB — CUP PACEART REMOTE DEVICE CHECK
Date Time Interrogation Session: 20240119231221
Implantable Pulse Generator Implant Date: 20210823

## 2022-09-25 NOTE — Progress Notes (Signed)
Carelink Summary Report / Loop Recorder

## 2022-10-12 ENCOUNTER — Encounter: Payer: Self-pay | Admitting: Physician Assistant

## 2022-10-12 ENCOUNTER — Ambulatory Visit: Payer: BC Managed Care – PPO | Attending: Physician Assistant | Admitting: Physician Assistant

## 2022-10-12 ENCOUNTER — Encounter: Payer: Self-pay | Admitting: *Deleted

## 2022-10-12 VITALS — BP 124/78 | HR 75 | Ht 65.0 in | Wt 169.2 lb

## 2022-10-12 DIAGNOSIS — I471 Supraventricular tachycardia, unspecified: Secondary | ICD-10-CM

## 2022-10-12 DIAGNOSIS — R0609 Other forms of dyspnea: Secondary | ICD-10-CM

## 2022-10-12 DIAGNOSIS — R5383 Other fatigue: Secondary | ICD-10-CM | POA: Diagnosis not present

## 2022-10-12 DIAGNOSIS — R55 Syncope and collapse: Secondary | ICD-10-CM

## 2022-10-12 DIAGNOSIS — I495 Sick sinus syndrome: Secondary | ICD-10-CM

## 2022-10-12 DIAGNOSIS — R072 Precordial pain: Secondary | ICD-10-CM

## 2022-10-12 DIAGNOSIS — R079 Chest pain, unspecified: Secondary | ICD-10-CM

## 2022-10-12 MED ORDER — METOPROLOL TARTRATE 100 MG PO TABS
ORAL_TABLET | ORAL | 0 refills | Status: DC
Start: 2022-10-12 — End: 2022-11-25

## 2022-10-12 NOTE — Patient Instructions (Signed)
Medication Instructions:  Your physician recommends that you continue on your current medications as directed. Please refer to the Current Medication list given to you today.  *If you need a refill on your cardiac medications before your next appointment, please call your pharmacy*   Lab Work: BMP today If you have labs (blood work) drawn today and your tests are completely normal, you will receive your results only by: [place] (if you have MyChart) OR A paper copy in the mail If you have any lab test that is abnormal or we need to change your treatment, we will call you to review the results.   Testing/Procedures: Your physician has requested that you have an echocardiogram. Echocardiography is a painless test that uses sound waves to create images of your heart. It provides your doctor with information about the size and shape of your heart and how well your heart's chambers and valves are working. This procedure takes approximately one hour. There are no restrictions for this procedure. Please do NOT wear cologne, perfume, aftershave, or lotions (deodorant is allowed). Please arrive 15 minutes prior to your appointment time.     Your cardiac CT will be scheduled at one of the below locations:   [facility] [address] [place], [place] [address] [phone] [place] [address] [place], [place] [address] [phone]  [facility] [place], [place] [address] [phone]  If scheduled at [facility], please arrive at the [facility] and Children's Entrance (Entrance C2) of [facility] 30 minutes prior to test start time. You can use the FREE valet parking offered at entrance C (encouraged to control the heart rate for the test)  Proceed to the [facility] Radiology Department (first floor) to check-in and test prep.  All radiology  patients and guests should use entrance C2 at [facility], accessed from [facility], even though the hospital's physical address listed is [address].    If scheduled at [facility] or [facility], please arrive 15 mins early for check-in and test prep.   Please follow these instructions carefully (unless otherwise directed):  Hold all erectile dysfunction medications at least 3 days (72 hrs) prior to test. (Ie viagra, cialis, sildenafil, tadalafil, etc) We will administer nitroglycerin during this exam.   On the Night Before the Test: Be sure to Drink plenty of water. Do not consume any caffeinated/decaffeinated beverages or chocolate 12 hours prior to your test. Do not take any antihistamines 12 hours prior to your test. If the patient has contrast allergy: Patient will need a prescription for Prednisone and very clear instructions (as follows): Prednisone 50 mg - take 13 hours prior to test Take another Prednisone 50 mg 7 hours prior to test Take another Prednisone 50 mg 1 hour prior to test Take Benadryl 50 mg 1 hour prior to test Patient must complete all four doses of above prophylactic medications. Patient will need a ride after test due to Benadryl.  On the Day of the Test: Drink plenty of water until 1 hour prior to the test. Do not eat any food 1 hour prior to test. You may take your regular medications prior to the test.  Take metoprolol (Lopressor) 100 mg two hours prior to test. If you take Furosemide/Hydrochlorothiazide/Spironolactone, please HOLD on the morning of the test. FEMALES- please wear underwire-free bra  if available, avoid dresses & tight clothing       After the Test: Drink plenty of water. After receiving IV contrast, you may experience a mild flushed feeling. This is normal. On occasion, you may experience a mild rash up to 24 hours after the test. This is not dangerous. If  this occurs, you can take Benadryl 25 mg and increase your fluid intake. If you experience trouble breathing, this can be serious. If it is severe call 911 IMMEDIATELY. If it is mild, please call our office. If you take any of these medications: Glipizide/Metformin, Avandament, Glucavance, please do not take 48 hours after completing test unless otherwise instructed.  We will call to schedule your test 2-4 weeks out understanding that some insurance companies will need an authorization prior to the service being performed.   For non-scheduling related questions, please contact the cardiac imaging nurse navigator should you have any questions/concerns: Marchia Bond, Cardiac Imaging Nurse Navigator Gordy Clement, Cardiac Imaging Nurse Navigator [facility] Direct Office Dial: [phone]   For scheduling needs, including cancellations and rescheduling, please call [place], [phone].    Follow-Up: At [facility], you and your health needs are our priority.  As part of our continuing mission to provide you with exceptional heart care, we have created designated Provider Care Teams.  These Care Teams include your primary Cardiologist (physician) and Advanced Practice Providers (APPs -  Physician Assistants and Nurse Practitioners) who all work together to provide you with the care you need, when you need it.   Your next appointment:   4-6 week(s)  Provider:   Freada Bergeron, MD or APP

## 2022-10-12 NOTE — Progress Notes (Signed)
Office Visit    Patient Name: Mercedes Beltran Date of Encounter: 10/12/2022  PCP:  Mercedes Bleacher, NP   [place]  Cardiologist:  Mercedes Bergeron, MD  Advanced Practice Provider:  No care team member to display Electrophysiologist:  None   HPI    Mercedes Beltran is a 57 y.o. female with a past medical history significant for hyperlipidemia, family history of premature CAD, COPD, coronary CTA 2018 (Ca score 0, no CAD and follow-up CTA 10/2019 with Ca score 0, no CAD), palpitations presents today for overdue follow-up visit.  She was last seen by Mercedes Beltran, Mercedes Beltran 02/19/2020 and at that time she was having symptoms of fatigue.  She was previously seen by Dr. Meda Beltran a few months prior and was having palpitations.  Cardiac monitor showed 5 very short burst of SVT and she was started on flecainide in addition to her metoprolol.  Eventually, flecainide and metoprolol were stopped due to heart rate of 45.  She was asked to follow-up with cardiology at this point.  Her electrocardiograms were reviewed.  It is evident that she was in sinus bradycardia with a heart rate of 45.  Labs were noted without any significant abnormalities.  Patient noted that she took flecainide along with metoprolol for about 3 to 4 months.  It made her feel fatigued.  She stopped several weeks prior to her last appointment.  She did feel better off these medications.  She was off her medications when she saw her PCP and was still tired.  At her last appointment, she felt better.  No recurrent palpitations.  She is not having syncope.  Although, she describes an episode of syncope about a year prior and did not have any prodrome with this.  Occurred while walking from 1 room to the next.  She did endorse occasional chest discomfort which she attributed to acid reflux.  She takes an antacid which helps.  She does have some dependent leg edema.  Today, she states that she is having chest pain. It occurs at  different times. She says it feeling like something is sitting on her chest. She does not do much physical activity so unsure if the pain is exertional. She works as a Designer, industrial/product in a prison. She does get SOB with some activity like climbing the stairs at work. She has had an upper GI workup done and states her WBC are high. She feels tired. She is euvolemic on exam today. She did have to take nitro one. She states the pain has gotten worse over the past few weeks. She does have palpitations but they have always been there and are not a big concern to her at this time. She does have a loop recorded in place. She sees Mercedes Beltran. She felt bad on flecainide and metoprolol so she is off these medications   No edema, orthopnea, PND.     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 Five episodes of very short SVTs, the longest lasting 18 beats. No atrial fibrillation. Symptoms don't correlate with arrhythmias.   Echocardiogram 06/30/2019 EF 60-65, no RWMA, normal RVSF   Coronary CTA  10/19/2019 Ca score = 0 No evidence of CAD  EKG:  EKG is not ordered today.   Recent Labs: No results found for requested labs within last 365 days.  Recent Lipid Panel No results found for: "CHOL", "TRIG", "HDL", "CHOLHDL", "VLDL", "LDLCALC", "LDLDIRECT"   Home Medications   Current Meds  Medication Sig   Biotin 1 MG CAPS Take 1 capsule by mouth daily.   cetirizine (ZYRTEC) 10 MG tablet Take 10 mg by mouth daily.   estradiol (ESTRACE) 0.1 MG/GM vaginal cream Place 1 Applicatorful vaginally daily.   famotidine (PEPCID) 40 MG tablet Take 40 mg by mouth daily.   metoprolol tartrate (LOPRESSOR) 100 MG tablet Take 1 tablet by mouth 2 hours prior to your CT.   MULTIPLE VITAMIN PO Take 1 tablet by mouth daily.   nitroGLYCERIN (NITROSTAT) 0.4 MG SL tablet PLACE 1 TABLET (0.4 MG TOTAL) UNDER THE TONGUE EVERY 5 (FIVE) MINUTES AS NEEDED FOR CHEST PAIN.   RABEprazole (ACIPHEX) 20 MG tablet TAKE 2 CAPSULES BY MOUTH ONCE A DAY BEFORE MEALS   tiZANidine (ZANAFLEX) 2 MG tablet Take 1-2 tablets (2-4 mg total) by mouth every 6 (six) hours as needed for muscle spasms.   trimethoprim (TRIMPEX) 100 MG tablet Take 100 mg by mouth daily.     Review of Systems      All other systems reviewed and are otherwise negative except as noted above.  Physical Exam    VS:  BP 124/78   Pulse 75   Ht 5' 5"$  (1.651 m)   Wt 169 lb 3.2 oz (76.7 kg)   SpO2 98%   BMI 28.16 kg/m  , BMI Body mass index is 28.16 kg/m.  Wt Readings from Last 3 Encounters:  10/12/22 169 lb 3.2 oz (76.7 kg)  03/16/22 163 lb (73.9 kg)  01/20/22 159 lb (72.1 kg)     GEN: Well nourished, well developed, in no acute distress. HEENT: normal. Neck: Supple, no JVD, carotid bruits, or masses. Cardiac: RRR, no murmurs, rubs, or gallops. No clubbing, cyanosis, edema.  Radials/PT 2+ and equal bilaterally.  Respiratory:  Respirations regular and unlabored, clear to auscultation bilaterally. GI: Soft, nontender, nondistended. MS: No  deformity or atrophy. Skin: Warm and dry, no rash. Neuro:  Strength and sensation are intact. Psych: Normal affect.  Assessment & Plan    Chest pain -continue ASA 29m, nitro as needed -atherosclerosis  showed on recent imaging -will update an echo -will plan for a coronary CTA given symptoms and history -BMP  Palpitations -wearing a loop recorder -She sees Dr. TLovena Beltran-not her most important concern today  Bradycardia -HR in the 70s -continue current medications -off metoprolol  SVT on monitor -referred to EP for further rhythm management  Fatigue -will do an ischemic workup and get an  updated echo  GERD -recent workup through GI -on multiple medications    Disposition: Follow up 4-6 weeks with Mercedes Bergeron, MD or APP.  Signed, Elgie Collard, PA-C 10/12/2022, 7:20 PM [facility]

## 2022-10-13 ENCOUNTER — Encounter: Payer: Self-pay | Admitting: *Deleted

## 2022-10-13 LAB — BASIC METABOLIC PANEL
BUN/Creatinine Ratio: 14 (ref 9–23)
BUN: 10 mg/dL (ref 6–24)
CO2: 22 mmol/L (ref 20–29)
Calcium: 9.4 mg/dL (ref 8.7–10.2)
Chloride: 103 mmol/L (ref 96–106)
Creatinine, Ser: 0.74 mg/dL (ref 0.57–1.00)
Glucose: 111 mg/dL — ABNORMAL HIGH (ref 70–99)
Potassium: 4.2 mmol/L (ref 3.5–5.2)
Sodium: 140 mmol/L (ref 134–144)
eGFR: 94 mL/min/{1.73_m2} (ref >59)

## 2022-10-14 ENCOUNTER — Encounter: Payer: Self-pay | Admitting: Hematology & Oncology

## 2022-10-14 NOTE — Progress Notes (Signed)
Requested that I call and reach out to Mercedes Beltran, patient's husband as he is easier to reach by phone.   Reached out to Mercedes Beltran to introduce myself as the office RN Navigator and explain our new patient process. Reviewed the reason for their referral and scheduled their new patient appointment along with labs. Provided address and directions to the office including call back phone number. Reviewed with patient any concerns they may have or any possible barriers to attending their appointment.   Informed patient about my role as a navigator and that I will meet with them prior to their New Patient appointment and more fully discuss what services I can provide. At this time patient has no further questions or needs.    Oncology Nurse Navigator Documentation     10/13/2022    3:30 PM  Oncology Nurse Navigator Flowsheets  Abnormal Finding Date 09/28/2022  Diagnosis Status Additional Work Up  Navigator Follow Up Date: 10/16/2022  Navigator Follow Up Reason: New Patient Appointment  Navigator Location [facility]  Referral Date to RadOnc/MedOnc 10/12/2022  Navigator Encounter Type Introductory Phone Call  Patient Visit Type MedOnc  Treatment Phase Abnormal Labs  Barriers/Navigation Needs Coordination of Care;Education  Education Other  Interventions Coordination of Care;Education  Acuity Level 2-Minimal Needs (1-2 Barriers Identified)  Coordination of Care Appts  Education Method Verbal  Time Spent with Patient 30

## 2022-10-16 ENCOUNTER — Encounter: Payer: Self-pay | Admitting: *Deleted

## 2022-10-16 ENCOUNTER — Encounter: Payer: Self-pay | Admitting: Hematology & Oncology

## 2022-10-16 ENCOUNTER — Other Ambulatory Visit: Payer: Self-pay

## 2022-10-16 ENCOUNTER — Inpatient Hospital Stay: Payer: BC Managed Care – PPO | Attending: Hematology & Oncology

## 2022-10-16 ENCOUNTER — Inpatient Hospital Stay: Payer: BC Managed Care – PPO | Admitting: Hematology & Oncology

## 2022-10-16 VITALS — BP 127/73 | HR 80 | Temp 97.8°F | Resp 18 | Ht 65.0 in | Wt 168.0 lb

## 2022-10-16 DIAGNOSIS — Z9049 Acquired absence of other specified parts of digestive tract: Secondary | ICD-10-CM

## 2022-10-16 DIAGNOSIS — Z79899 Other long term (current) drug therapy: Secondary | ICD-10-CM

## 2022-10-16 DIAGNOSIS — K760 Fatty (change of) liver, not elsewhere classified: Secondary | ICD-10-CM | POA: Insufficient documentation

## 2022-10-16 DIAGNOSIS — Z800 Family history of malignant neoplasm of digestive organs: Secondary | ICD-10-CM

## 2022-10-16 DIAGNOSIS — R12 Heartburn: Secondary | ICD-10-CM | POA: Insufficient documentation

## 2022-10-16 DIAGNOSIS — R935 Abnormal findings on diagnostic imaging of other abdominal regions, including retroperitoneum: Secondary | ICD-10-CM

## 2022-10-16 DIAGNOSIS — L299 Pruritus, unspecified: Secondary | ICD-10-CM | POA: Diagnosis not present

## 2022-10-16 DIAGNOSIS — C8299 Follicular lymphoma, unspecified, extranodal and solid organ sites: Secondary | ICD-10-CM

## 2022-10-16 DIAGNOSIS — J449 Chronic obstructive pulmonary disease, unspecified: Secondary | ICD-10-CM | POA: Diagnosis not present

## 2022-10-16 DIAGNOSIS — R5383 Other fatigue: Secondary | ICD-10-CM | POA: Diagnosis not present

## 2022-10-16 DIAGNOSIS — Z87891 Personal history of nicotine dependence: Secondary | ICD-10-CM

## 2022-10-16 DIAGNOSIS — Z803 Family history of malignant neoplasm of breast: Secondary | ICD-10-CM | POA: Diagnosis not present

## 2022-10-16 DIAGNOSIS — C8599 Non-Hodgkin lymphoma, unspecified, extranodal and solid organ sites: Secondary | ICD-10-CM

## 2022-10-16 DIAGNOSIS — Z90710 Acquired absence of both cervix and uterus: Secondary | ICD-10-CM

## 2022-10-16 DIAGNOSIS — Z885 Allergy status to narcotic agent status: Secondary | ICD-10-CM | POA: Insufficient documentation

## 2022-10-16 DIAGNOSIS — R160 Hepatomegaly, not elsewhere classified: Secondary | ICD-10-CM | POA: Insufficient documentation

## 2022-10-16 DIAGNOSIS — R110 Nausea: Secondary | ICD-10-CM

## 2022-10-16 DIAGNOSIS — Z801 Family history of malignant neoplasm of trachea, bronchus and lung: Secondary | ICD-10-CM

## 2022-10-16 DIAGNOSIS — Z8249 Family history of ischemic heart disease and other diseases of the circulatory system: Secondary | ICD-10-CM

## 2022-10-16 DIAGNOSIS — K909 Intestinal malabsorption, unspecified: Secondary | ICD-10-CM

## 2022-10-16 LAB — CBC WITH DIFFERENTIAL (CANCER CENTER ONLY)
Abs Immature Granulocytes: 0.03 10*3/uL (ref 0.00–0.07)
Basophils Absolute: 0.1 10*3/uL (ref 0.0–0.1)
Basophils Relative: 1 %
Eosinophils Absolute: 0.1 10*3/uL (ref 0.0–0.5)
Eosinophils Relative: 2 %
HCT: 46.1 % — ABNORMAL HIGH (ref 36.0–46.0)
Hemoglobin: 15.3 g/dL — ABNORMAL HIGH (ref 12.0–15.0)
Immature Granulocytes: 0 %
Lymphocytes Relative: 36 %
Lymphs Abs: 2.7 10*3/uL (ref 0.7–4.0)
MCH: 29.9 pg (ref 26.0–34.0)
MCHC: 33.2 g/dL (ref 30.0–36.0)
MCV: 90.2 fL (ref 80.0–100.0)
Monocytes Absolute: 0.7 10*3/uL (ref 0.1–1.0)
Monocytes Relative: 9 %
Neutro Abs: 4 10*3/uL (ref 1.7–7.7)
Neutrophils Relative %: 52 %
Platelet Count: 290 10*3/uL (ref 150–400)
RBC: 5.11 MIL/uL (ref 3.87–5.11)
RDW: 12.7 % (ref 11.5–15.5)
WBC Count: 7.6 10*3/uL (ref 4.0–10.5)
nRBC: 0 % (ref 0.0–0.2)

## 2022-10-16 LAB — FERRITIN: Ferritin: 20 ng/mL (ref 11–307)

## 2022-10-16 LAB — IRON AND IRON BINDING CAPACITY (CC-WL,HP ONLY)
Iron: 131 ug/dL (ref 28–170)
Saturation Ratios: 33 % — ABNORMAL HIGH (ref 10.4–31.8)
TIBC: 393 ug/dL (ref 250–450)
UIBC: 262 ug/dL (ref 148–442)

## 2022-10-16 LAB — CMP (CANCER CENTER ONLY)
ALT: 48 U/L — ABNORMAL HIGH (ref 0–44)
AST: 27 U/L (ref 15–41)
Albumin: 4.6 g/dL (ref 3.5–5.0)
Alkaline Phosphatase: 110 U/L (ref 38–126)
Anion gap: 10 (ref 5–15)
BUN: 16 mg/dL (ref 6–20)
CO2: 28 mmol/L (ref 22–32)
Calcium: 9.9 mg/dL (ref 8.9–10.3)
Chloride: 103 mmol/L (ref 98–111)
Creatinine: 0.78 mg/dL (ref 0.44–1.00)
GFR, Estimated: >60 mL/min (ref >60)
Glucose, Bld: 112 mg/dL — ABNORMAL HIGH (ref 70–99)
Potassium: 3.9 mmol/L (ref 3.5–5.1)
Sodium: 141 mmol/L (ref 135–145)
Total Bilirubin: 0.7 mg/dL (ref 0.3–1.2)
Total Protein: 7.7 g/dL (ref 6.5–8.1)

## 2022-10-16 LAB — LACTATE DEHYDROGENASE: LDH: 160 U/L (ref 98–192)

## 2022-10-16 LAB — SAVE SMEAR(SSMR), FOR PROVIDER SLIDE REVIEW

## 2022-10-16 NOTE — Progress Notes (Signed)
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 Hanley Seamen patient "Your Patient Navigator" handout which explains my role, areas in which I am able to help, and all the contact information for myself and the office. Also gave patient MD and Navigator business card. Reviewed with patient the general overview of expected course after initial diagnosis and time frame for all steps to be completed.  New patient packet given to patient which includes: orientation to office and staff; campus directory; education on My Chart and Advance Directives; and patient centered education on lymphoma.   She comes in by herself. 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 She is going to send me a work schedule to try to use when scheduling the best I can. She also requests that her husband be called to schedule as he is much easier to get by phone. She lives with her husband and he is a good support to her.   Patient completed visit with Dr. Marin Olp.   Patient will need PET and bone marrow biopsy.  PET scheduled for 10/30/2022 BMBx scheduled for 10/29/22  Patient understands all follow up procedures and expectations. They have my number to reach out for any further clarification or additional needs.   Oncology Nurse Navigator Documentation     10/16/2022   11:15 AM  Oncology Nurse Navigator Flowsheets  Navigator Follow Up Date: 10/30/2022  Navigator Follow Up Reason: Scan Review  Navigator Location [facility]  Navigator Encounter Type Initial MedOnc  Patient Visit Type MedOnc  Treatment Phase Abnormal Labs  Barriers/Navigation Needs Coordination of Care;Education  Education Newly Diagnosed Cancer Education;Other  Interventions Coordination of Care;Education;Psycho-Social Support  Acuity Level 2-Minimal Needs (1-2 Barriers Identified)   Coordination of Care Radiology  Education Method Verbal;Written  Support Groups/Services Friends and Family  Time Spent with Patient 34

## 2022-10-16 NOTE — Progress Notes (Signed)
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  HPI: Ms. Greil is a very charming 57 year old white female.  We actually saw her 8 years ago.  I think we transfused her 8 years ago.  She does have multiple health issues.  She has not been feeling all that well.  She has been followed by gastroenterology for an enlarged liver.  She has she had a MRI of the abdomen back in May 2023.  This does show some hepatomegaly.  Liver that she had hepatic steatosis.  She had a CT of the abdomen pelvis in January of this year.  This also showed the hepatomegaly.  However, nothing else was noted in the abdomen or pelvis.  She has been having some issues with respect to her throat.  She has seen ENT.  She has had a biopsies which have been unremarkable.  She had the upper endoscopy on 09/28/2022.  There was nothing that was actually noted.  I think the biopsies were taken for evaluation of his send for look esophagitis.  She had some mucosal changes characterized by this by discoloration in the second portion of the duodenum.  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  This appeared to be a grade 1 lymphoma.  Because of this, she was calmly referred to the [facility].  Of note, the lymphocytes that were noted were CD20 positive.  There is CD10 positive.  These were all consistent with a follicular lymphoma.  She has not had a PET scan.  She just does not feel that well.  She has not lost weight.  She has had some abdominal bloating.  She has had no change in bowel or bladder habits.  She has had no issues with COVID.  She is still working.  She is a substance abuse counselor.  I know that she is quite busy.  She has had no rashes.  She has she does have some pruritus.  She has had no cough.  She has not noted any  swollen lymph nodes.  She does not smoke.  Overall, I would say that her performance status is probably ECOG 1.    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   Ovarian cyst    Shortness of breath dyspnea    since 08/2014  :   Past Surgical History:  Procedure Laterality Date   ABDOMINAL HYSTERECTOMY     CHOLECYSTECTOMY    :   Current Outpatient Medications:    nitrofurantoin, macrocrystal-monohydrate, (MACROBID) 100 MG capsule, Take 100 mg by mouth 2 (two) times daily., Disp: , Rfl:    Biotin 1 MG CAPS, Take 1 capsule by mouth daily., Disp: , Rfl:    cetirizine (ZYRTEC) 10 MG tablet, Take 10 mg by mouth daily., Disp: , Rfl:    estradiol (ESTRACE) 0.1 MG/GM vaginal cream, Place 1 Applicatorful vaginally daily., Disp: , Rfl:    famotidine (PEPCID) 40 MG tablet, Take 40  mg by mouth daily., Disp: , Rfl:    metoprolol tartrate (LOPRESSOR) 100 MG tablet, Take 1 tablet by mouth 2 hours prior to your CT., Disp: 1 tablet, Rfl: 0   MULTIPLE VITAMIN PO, Take 1 tablet by mouth daily., Disp: , Rfl:    nitroGLYCERIN (NITROSTAT) 0.4 MG SL tablet, PLACE 1 TABLET (0.4 MG TOTAL) UNDER THE TONGUE EVERY 5 (FIVE) MINUTES AS NEEDED FOR CHEST PAIN., Disp: 25 tablet, Rfl: 3   RABEprazole (ACIPHEX) 20 MG tablet, TAKE 2 CAPSULES BY MOUTH ONCE A DAY BEFORE MEALS, Disp: , Rfl:    tiZANidine (ZANAFLEX) 2 MG tablet, Take 1-2 tablets (2-4 mg total) by mouth every 6 (six) hours as needed for muscle spasms., Disp: 60 tablet, Rfl: 1   trimethoprim (TRIMPEX) 100 MG tablet, Take 100 mg by mouth daily., Disp: , Rfl: :  :   Allergies  Allergen Reactions   Demerol  [Meperidine Hcl] Nausea And Vomiting    Codeine Nausea And Vomiting   Demerol [Meperidine] Nausea And Vomiting   Morphine And Related Nausea And Vomiting  :   Family History  Problem Relation Age of Onset   Breast cancer Paternal Aunt        x6, not sure of ages of onset   Cancer Mother        colon, liver,lung   Heart disease Maternal Grandmother    Cancer Maternal Grandfather   :   Social History   Socioeconomic History   Marital status: Married    Spouse name: Not on file   Number of children: Not on file   Years of education: Not on file   Highest education level: Not on file  Occupational History   Not on file  Tobacco Use   Smoking status: Former    Types: Cigarettes    Quit date: 02/20/2011    Years since quitting: 11.6   Smokeless tobacco: Never   Tobacco comments:    quit 01/2011  Substance and Sexual Activity   Alcohol use: No    Alcohol/week: 0.0 standard drinks of alcohol   Drug use: Not Currently   Sexual activity: Yes  Other Topics Concern   Not on file  Social History Narrative   Not on file   Social Determinants of Health   Financial Resource Strain: Not on file  Food Insecurity: No Food Insecurity (10/16/2022)   Hunger Vital Sign    Worried About Running Out of Food in the Last Year: Never true    Ran Out of Food in the Last Year: Never true  Transportation Needs: No Transportation Needs (10/16/2022)   PRAPARE - Hydrologist (Medical): No    Lack of Transportation (Non-Medical): No  Physical Activity: Not on file  Stress: Not on file  Social Connections: Not on file  Intimate Partner Violence: Not At Risk (10/16/2022)   Humiliation, Afraid, Rape, and Kick questionnaire    Fear of Current or Ex-Partner: No    Emotionally Abused: No    Physically Abused: No    Sexually Abused: No  :  Review of Systems  Constitutional:  Positive for malaise/fatigue.  HENT: Negative.    Eyes: Negative.   Respiratory: Negative.    Cardiovascular: Negative.    Gastrointestinal:  Positive for heartburn and nausea.  Genitourinary: Negative.   Musculoskeletal: Negative.   Skin:  Positive for itching.  Neurological: Negative.   Endo/Heme/Allergies: Negative.   Psychiatric/Behavioral: Negative.      Exam: Vital signs show  temperature of 97.8.  Pulse 80.  Blood pressure 127/73.  Weight is 168 pounds.  @IPVITALS$ @ Physical Exam Vitals reviewed.  HENT:     Head: Normocephalic and atraumatic.  Eyes:     Pupils: Pupils are equal, round, and reactive to light.  Cardiovascular:     Rate and Rhythm: Normal rate and regular rhythm.     Heart sounds: Normal heart sounds.  Pulmonary:     Effort: Pulmonary effort is normal.     Breath sounds: Normal breath sounds.  Abdominal:     General: Bowel sounds are normal.     Palpations: Abdomen is soft.  Musculoskeletal:        General: No tenderness or deformity. Normal range of motion.     Cervical back: Normal range of motion.  Lymphadenopathy:     Cervical: No cervical adenopathy.  Skin:    General: Skin is warm and dry.     Findings: No erythema or rash.  Neurological:     Mental Status: She is alert and oriented to person, place, and time.  Psychiatric:        Behavior: Behavior normal.        Thought Content: Thought content normal.        Judgment: Judgment normal.     Recent Labs    10/16/22 1054  WBC 7.6  HGB 15.3*  HCT 46.1*  PLT 290    Recent Labs    10/16/22 1054  NA 141  K 3.9  CL 103  CO2 28  GLUCOSE 112*  BUN 16  CREATININE 0.78  CALCIUM 9.9    Blood smear review: None  Pathology: See above     Assessment and Plan: Ms. Swink is a very nice 57 year old white female.  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  I think this was probably found by chance.  By her last scans, there is certainly does not appear to be any intestinal wall thickening.  I really think we are going to have to do a PET scan on her.  I also think she is going need to have a bone  marrow biopsy to be done to see if there is any systemic lymphoma that might be in her bone marrow.  I think the real issue is whether or not we have to treat her.  I think we do have to treat her, would not be able to use just single agent Rituxan.  Again is hard to say whether this lymphoma is causing her problems.  She does have some pruritus.  I would think though that pruritus  caused by lymphoma will be a more aggressive form of lymphoma.  We have not seen her for 8 years.  It was nice seeing her again.  She has 5 grandchildren.  We will see about the PET scan.  Will then see about a bone marrow biopsy on her.  I will plan to get her back once we get these results back.

## 2022-10-17 LAB — BETA 2 MICROGLOBULIN, SERUM: Beta-2 Microglobulin: 1.5 mg/L (ref 0.6–2.4)

## 2022-10-18 LAB — IGG, IGA, IGM
IgA: 278 mg/dL (ref 87–352)
IgG (Immunoglobin G), Serum: 1124 mg/dL (ref 586–1602)
IgM (Immunoglobulin M), Srm: 73 mg/dL (ref 26–217)

## 2022-10-21 ENCOUNTER — Other Ambulatory Visit: Payer: Self-pay | Admitting: Neurosurgery

## 2022-10-21 ENCOUNTER — Telehealth: Payer: Self-pay | Admitting: Cardiology

## 2022-10-21 ENCOUNTER — Other Ambulatory Visit: Payer: BC Managed Care – PPO

## 2022-10-21 ENCOUNTER — Ambulatory Visit: Payer: BC Managed Care – PPO | Attending: Physician Assistant

## 2022-10-21 ENCOUNTER — Encounter: Payer: Self-pay | Admitting: *Deleted

## 2022-10-21 DIAGNOSIS — R0609 Other forms of dyspnea: Secondary | ICD-10-CM | POA: Diagnosis not present

## 2022-10-21 DIAGNOSIS — R072 Precordial pain: Secondary | ICD-10-CM | POA: Diagnosis not present

## 2022-10-21 DIAGNOSIS — D320 Benign neoplasm of cerebral meninges: Secondary | ICD-10-CM

## 2022-10-21 LAB — ECHOCARDIOGRAM COMPLETE
Area-P 1/2: 3.99 cm2
S' Lateral: 2.6 cm

## 2022-10-21 NOTE — Telephone Encounter (Signed)
Will send this message to the ordering Provider Nicholes Rough PA-C) and her CMA for further advisement on work note for the pt.

## 2022-10-21 NOTE — Telephone Encounter (Signed)
Patient stated she will need a doctor's note for work as she was seen today for an Echo test.  Patient stated can put note in [place].

## 2022-10-21 NOTE — Telephone Encounter (Signed)
Work note sent to patient via Pharmacist, community.

## 2022-10-22 ENCOUNTER — Encounter: Payer: Self-pay | Admitting: Hematology & Oncology

## 2022-10-23 ENCOUNTER — Telehealth (HOSPITAL_COMMUNITY): Payer: Self-pay | Admitting: Emergency Medicine

## 2022-10-23 NOTE — Telephone Encounter (Signed)
Attempted to call patient regarding upcoming cardiac CT appointment. °Left message on voicemail with name and callback number °Hassani Sliney RN Navigator Cardiac Imaging °[facility] °[phone] Office °[phone] Cell ° °

## 2022-10-26 ENCOUNTER — Ambulatory Visit (HOSPITAL_COMMUNITY)
Admission: RE | Admit: 2022-10-26 | Discharge: 2022-10-26 | Disposition: A | Discharge status: Home / Self Care | Payer: BC Managed Care – PPO | Source: Ambulatory Visit | Attending: Physician Assistant | Admitting: Physician Assistant

## 2022-10-26 ENCOUNTER — Ambulatory Visit: Payer: BC Managed Care – PPO

## 2022-10-26 DIAGNOSIS — R072 Precordial pain: Secondary | ICD-10-CM | POA: Diagnosis present

## 2022-10-26 DIAGNOSIS — R55 Syncope and collapse: Secondary | ICD-10-CM | POA: Diagnosis not present

## 2022-10-26 MED ORDER — NITROGLYCERIN 0.4 MG SL SUBL
0.8000 mg | SUBLINGUAL_TABLET | Freq: Once | SUBLINGUAL | Status: AC
  Administered 2022-10-26: 0.8 mg via SUBLINGUAL

## 2022-10-26 MED ORDER — NITROGLYCERIN 0.4 MG SL SUBL
SUBLINGUAL_TABLET | SUBLINGUAL | Status: AC
  Filled 2022-10-26: qty 2

## 2022-10-26 MED ORDER — IOHEXOL 350 MG/ML SOLN
95.0000 mL | Freq: Once | INTRAVENOUS | Status: AC | PRN
  Administered 2022-10-26: 95 mL via INTRAVENOUS

## 2022-10-27 LAB — CUP PACEART REMOTE DEVICE CHECK
Date Time Interrogation Session: 20240221230905
Implantable Pulse Generator Implant Date: 20210823

## 2022-10-28 ENCOUNTER — Other Ambulatory Visit: Payer: Self-pay | Admitting: Radiology

## 2022-10-28 DIAGNOSIS — D509 Iron deficiency anemia, unspecified: Secondary | ICD-10-CM

## 2022-10-29 ENCOUNTER — Ambulatory Visit (HOSPITAL_COMMUNITY)
Admission: RE | Admit: 2022-10-29 | Discharge: 2022-10-29 | Disposition: A | Discharge status: Home / Self Care | Payer: BC Managed Care – PPO | Source: Ambulatory Visit | Attending: Hematology & Oncology | Admitting: Hematology & Oncology

## 2022-10-29 ENCOUNTER — Other Ambulatory Visit: Payer: Self-pay

## 2022-10-29 ENCOUNTER — Encounter: Payer: Self-pay | Admitting: *Deleted

## 2022-10-29 ENCOUNTER — Encounter (HOSPITAL_COMMUNITY): Payer: Self-pay

## 2022-10-29 DIAGNOSIS — C8599 Non-Hodgkin lymphoma, unspecified, extranodal and solid organ sites: Secondary | ICD-10-CM | POA: Insufficient documentation

## 2022-10-29 DIAGNOSIS — J449 Chronic obstructive pulmonary disease, unspecified: Secondary | ICD-10-CM | POA: Insufficient documentation

## 2022-10-29 DIAGNOSIS — Z87891 Personal history of nicotine dependence: Secondary | ICD-10-CM | POA: Insufficient documentation

## 2022-10-29 DIAGNOSIS — D509 Iron deficiency anemia, unspecified: Secondary | ICD-10-CM

## 2022-10-29 HISTORY — PX: IR BONE MARROW BIOPSY & ASPIRATION: IMG5727

## 2022-10-29 LAB — CBC WITH DIFFERENTIAL/PLATELET
Abs Immature Granulocytes: 0.01 10*3/uL (ref 0.00–0.07)
Basophils Absolute: 0.1 10*3/uL (ref 0.0–0.1)
Basophils Relative: 1 %
Eosinophils Absolute: 0.2 10*3/uL (ref 0.0–0.5)
Eosinophils Relative: 3 %
HCT: 44.8 % (ref 36.0–46.0)
Hemoglobin: 14.6 g/dL (ref 12.0–15.0)
Immature Granulocytes: 0 %
Lymphocytes Relative: 43 %
Lymphs Abs: 2.6 10*3/uL (ref 0.7–4.0)
MCH: 29.6 pg (ref 26.0–34.0)
MCHC: 32.6 g/dL (ref 30.0–36.0)
MCV: 90.7 fL (ref 80.0–100.0)
Monocytes Absolute: 0.6 10*3/uL (ref 0.1–1.0)
Monocytes Relative: 9 %
Neutro Abs: 2.7 10*3/uL (ref 1.7–7.7)
Neutrophils Relative %: 44 %
Platelets: 284 10*3/uL (ref 150–400)
RBC: 4.94 MIL/uL (ref 3.87–5.11)
RDW: 13 % (ref 11.5–15.5)
WBC: 6.1 10*3/uL (ref 4.0–10.5)
nRBC: 0 % (ref 0.0–0.2)

## 2022-10-29 MED ORDER — FENTANYL CITRATE (PF) 100 MCG/2ML IJ SOLN
INTRAMUSCULAR | Status: AC | PRN
  Administered 2022-10-29 (×2): 50 ug via INTRAVENOUS

## 2022-10-29 MED ORDER — FENTANYL CITRATE (PF) 100 MCG/2ML IJ SOLN
INTRAMUSCULAR | Status: AC
  Filled 2022-10-29: qty 2

## 2022-10-29 MED ORDER — LIDOCAINE HCL (PF) 1 % IJ SOLN
INTRAMUSCULAR | Status: AC | PRN
  Administered 2022-10-29: 20 mL

## 2022-10-29 MED ORDER — LIDOCAINE HCL (PF) 1 % IJ SOLN
INTRAMUSCULAR | Status: AC
  Administered 2022-10-29: 5 mL via INTRADERMAL
  Filled 2022-10-29: qty 30

## 2022-10-29 MED ORDER — MIDAZOLAM HCL 2 MG/2ML IJ SOLN
INTRAMUSCULAR | Status: AC
  Filled 2022-10-29: qty 4

## 2022-10-29 MED ORDER — SODIUM CHLORIDE 0.9 % IV SOLN: INTRAVENOUS | Status: DC

## 2022-10-29 MED ORDER — MIDAZOLAM HCL 2 MG/2ML IJ SOLN
INTRAMUSCULAR | Status: AC | PRN
  Administered 2022-10-29 (×2): 1 mg via INTRAVENOUS

## 2022-10-29 NOTE — Discharge Instructions (Signed)
Moderate Conscious Sedation, Adult, Care After This sheet gives you information about how to care for yourself after your procedure. Your health care provider may also give you more specific instructions. If you have problems or questions, contact your health care provider. What can I expect after the procedure? After the procedure, it is common to have: Sleepiness for several hours. Impaired judgment for several hours. Difficulty with balance. Vomiting if you eat too soon. Follow these instructions at home: For the time period you were told by your health care provider:     Rest. Do not participate in activities where you could fall or become injured. Do not drive or use machinery. Do not drink alcohol. Do not take sleeping pills or medicines that cause drowsiness. Do not make important decisions or sign legal documents. Do not take care of children on your own. Eating and drinking  Follow the diet recommended by your health care provider. Drink enough fluid to keep your urine pale yellow. If you vomit: Drink water, juice, or soup when you can drink without vomiting. Make sure you have little or no nausea before eating solid foods. General instructions Take over-the-counter and prescription medicines only as told by your health care provider. Have a responsible adult stay with you for the time you are told. It is important to have someone help care for you until you are awake and alert. Do not smoke. Keep all follow-up visits as told by your health care provider. This is important. Contact a health care provider if: You are still sleepy or having trouble with balance after 24 hours. You feel light-headed. You keep feeling nauseous or you keep vomiting. You develop a rash. You have a fever. You have redness or swelling around the IV site. Get help right away if: You have trouble breathing. You have new-onset confusion at home. Summary After the procedure, it is common to  feel sleepy, have impaired judgment, or feel nauseous if you eat too soon. Rest after you get home. Know the things you should not do after the procedure. Follow the diet recommended by your health care provider and drink enough fluid to keep your urine pale yellow. Get help right away if you have trouble breathing or new-onset confusion at home. This information is not intended to replace advice given to you by your health care provider. Make sure you discuss any questions you have with your health care provider. Document Revised: 12/15/2019 Document Reviewed: 07/13/2019 Elsevier Patient Education  [place].    Bone Marrow Aspiration and Bone Marrow Biopsy, Adult, Care After This sheet gives you information about how to care for yourself after your procedure. Your health care provider may also give you more specific instructions. If you have problems or questions, contact your health careprovider. What can I expect after the procedure? After the procedure, it is common to have: Mild pain and tenderness. Swelling. Bruising. Follow these instructions at home: Puncture site care Follow instructions from your health care provider about how to take care of the puncture site. Make sure you: Wash your hands with soap and water before and after you change your bandage (dressing). If soap and water are not available, use hand sanitizer. Change your dressing as told by your health care provider. Check your puncture site every day for signs of infection. Check for: More redness, swelling, or pain. Fluid or blood. Warmth. Pus or a bad smell.  Activity Return to your normal activities as told by your health care provider.  Ask your health care provider what activities are safe for you. Do not lift anything that is heavier than 10 lb (4.5 kg), or the limit that you are told, until your health care provider says that it is safe. Do not drive for 24 hours if you were given a sedative during  your procedure. General instructions Take over-the-counter and prescription medicines only as told by your health care provider. Do not take baths, swim, or use a hot tub until your health care provider approves. Ask your health care provider if you may take showers. You may only be allowed to take sponge baths. If directed, put ice on the affected area. To do this: Put ice in a plastic bag. Place a towel between your skin and the bag. Leave the ice on for 20 minutes, 2-3 times a day. Keep all follow-up visits as told by your health care provider. This is important.  Contact a health care provider if: Your pain is not controlled with medicine. You have a fever. You have more redness, swelling, or pain around the puncture site. You have fluid or blood coming from the puncture site. Your puncture site feels warm to the touch. You have pus or a bad smell coming from the puncture site. Summary After the procedure, it is common to have mild pain, tenderness, swelling, and bruising. Follow instructions from your health care provider about how to take care of the puncture site and what activities are safe for you. Take over-the-counter and prescription medicines only as told by your health care provider. Contact a health care provider if you have any signs of infection, such as fluid or blood coming from the puncture site. This information is not intended to replace advice given to you by your health care provider. Make sure you discuss any questions you have with your healthcare provider. Document Revised: 01/03/2019 Document Reviewed: 01/03/2019 Elsevier Patient Education  [place].

## 2022-10-29 NOTE — H&P (Signed)
Chief Complaint: Patient was seen in consultation today for image-guided bone marrow biopsy  Referring Physician(s): Ennever,Peter R  Supervising Physician: Corrie Mckusick  Patient Status: [facility] - Out-pt  History of Present Illness: Mercedes Beltran is a 57 y.o. female with PMH significant for COPD and recently diagnosed small bowel lymphoma being seen today for image-guided bone marrow biopsy. The patient was diagnosed with small bowel lymphoma following a duodenal biopsy performed 09/28/22. The patient is being followed by Dr Marin Olp from Oncology who has referred the patient to IR for bone marrow biopsy.   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 Authorizing Provider  Biotin 1 MG CAPS Take 1 capsule by mouth daily.   Yes [provider]  cetirizine (ZYRTEC) 10 MG tablet Take 10 mg by mouth daily. 11/12/21  Yes [provider]  estradiol (ESTRACE) 0.1 MG/GM vaginal cream Place 1 Applicatorful vaginally daily. 10/12/22  Yes [provider]  famotidine (PEPCID) 40 MG tablet Take 40 mg by mouth daily.   Yes [provider]  RABEprazole (ACIPHEX) 20 MG tablet TAKE 2 CAPSULES BY MOUTH ONCE A DAY BEFORE MEALS 05/25/19  Yes [provider]  tiZANidine (ZANAFLEX) 2 MG tablet Take 1-2  tablets (2-4 mg total) by mouth every 6 (six) hours as needed for muscle spasms. 03/06/20  Yes Hilts, Legrand Como, MD  metoprolol tartrate (LOPRESSOR) 100 MG tablet Take 1 tablet by mouth 2 hours prior to your CT. 10/12/22   Elgie Collard, PA-C  MULTIPLE VITAMIN PO Take 1 tablet by mouth daily.    [provider]  nitroGLYCERIN (NITROSTAT) 0.4 MG SL tablet PLACE 1 TABLET (0.4 MG TOTAL) UNDER THE TONGUE EVERY 5 (FIVE) MINUTES AS NEEDED FOR CHEST PAIN. 07/25/19 10/12/22  Kathyrn Drown D, NP  trimethoprim (TRIMPEX) 100 MG tablet Take 100 mg by mouth daily. 10/12/22 04/10/23  [provider]     Family History  Problem Relation Age of Onset   Breast cancer Paternal Aunt        x6, not sure of ages of onset   Cancer Mother        colon, liver,lung   Heart disease Maternal Grandmother    Cancer Maternal Grandfather     Social History   Socioeconomic History   Marital status: Married    Spouse name: Not on file   Number of children: Not on file   Years of education: Not on file   Highest education level: Not on file  Occupational History   Not on file  Tobacco Use   Smoking status: Former    Types: Cigarettes    Quit date: 02/20/2011    Years since quitting: 11.6   Smokeless tobacco: Never   Tobacco comments:  quit 01/2011  Substance and Sexual Activity   Alcohol use: No    Alcohol/week: 0.0 standard drinks of alcohol   Drug use: Not Currently   Sexual activity: Yes  Other Topics Concern   Not on file  Social History Narrative   Not on file   Social Determinants of Health   Financial Resource Strain: Not on file  Food Insecurity: No Food Insecurity (10/16/2022)   Hunger Vital Sign    Worried About Running Out of Food in the Last Year: Never true    Ran Out of Food in the Last Year: Never true  Transportation Needs: No Transportation Needs (10/16/2022)   PRAPARE - Hydrologist (Medical): No    Lack of Transportation (Non-Medical): No   Physical Activity: Not on file  Stress: Not on file  Social Connections: Not on file    Code Status: Full Code  Review of Systems: A 12 point ROS discussed and pertinent positives are indicated in the HPI above.  All other systems are negative.  Review of Systems  Constitutional:  Negative for chills and fever.  Respiratory:  Negative for chest tightness and shortness of breath.   Cardiovascular:  Negative for chest pain and leg swelling.  Gastrointestinal:  Positive for abdominal pain and nausea. Negative for diarrhea and vomiting.  Neurological:  Negative for dizziness and headaches.  Psychiatric/Behavioral:  Negative for confusion.     Vital Signs: Ht '5\' 5"'$  (1.651 m)   Wt 160 lb (72.6 kg)   BMI 26.63 kg/m     Physical Exam Vitals reviewed.  Constitutional:      General: She is not in acute distress.    Appearance: She is not ill-appearing.  HENT:     Mouth/Throat:     Mouth: Mucous membranes are moist.  Cardiovascular:     Rate and Rhythm: Normal rate and regular rhythm.     Pulses: Normal pulses.     Heart sounds: Normal heart sounds.  Pulmonary:     Effort: Pulmonary effort is normal.     Breath sounds: Normal breath sounds.  Abdominal:     General: Bowel sounds are normal.     Palpations: Abdomen is soft.     Tenderness: There is abdominal tenderness.  Musculoskeletal:     Right lower leg: No edema.     Left lower leg: No edema.  Skin:    General: Skin is warm and dry.  Neurological:     Mental Status: She is alert and oriented to person, place, and time.  Psychiatric:        Mood and Affect: Mood normal.        Behavior: Behavior normal.        Thought Content: Thought content normal.        Judgment: Judgment normal.     Imaging: CUP PACEART REMOTE DEVICE CHECK  Result Date: 10/27/2022 ILR summary report received. Battery status OK. Normal device function. No new symptom, tachy, brady, or pause episodes. No new AF episodes. Monthly summary  reports and ROV/PRN LA  CT CORONARY MORPH W/CTA COR W/SCORE W/CA W/CM &/OR WO/CM  Addendum Date: 10/27/2022   ADDENDUM REPORT: 10/27/2022 12:50 EXAM: OVER-READ INTERPRETATION  CT CHEST The following report is an over-read performed by radiologist Dr. Mina Marble [facility] on 10/27/2022. This over-read does not include interpretation of cardiac or coronary anatomy or pathology. The coronary CTA interpretation by the cardiologist is attached. COMPARISON:  Chest CT 01/21/2022 FINDINGS:  Vascular:The included aorta is normal in caliber. Mediastinum/nodes: No adenopathy or mass.  Tiny hiatal hernia. Lungs: Emphysema. Subsegmental bandlike atelectasis or scarring within the right middle lobe, left lower lobe and lingula, unchanged from prior exam. No acute airspace disease. No pleural fluid. The included airways are patent. Upper abdomen: No acute findings. Stable 2.6 cm water density lesion arising from the anterior spleen consistent with cyst, previously characterized on MRI as cyst. Hepatic steatosis suspected, although not optimally assessed due to phase of contrast. Musculoskeletal: There are no acute or suspicious osseous abnormalities. IMPRESSION: 1. Emphysema. 2. Stable splenic cyst. 3. Tiny hiatal hernia. Electronically Signed   By: Keith Rake M.D.   On: 10/27/2022 12:50   Result Date: 10/27/2022 CLINICAL DATA:  57 year old with chest pain. EXAM: Cardiac/Coronary  CTA TECHNIQUE: The patient was scanned on a [employer]. FINDINGS: A 80 kV prospective scan was triggered in the descending thoracic aorta at 111 HU's. Axial non-contrast 3 mm slices were carried out through the heart. The data set was analyzed on a dedicated work station and scored using the [place]. Gantry rotation speed was 250 msecs and collimation was .6 mm. 0.8 mg of sl NTG was given. The 3D data set was reconstructed in 5% intervals of the 67-82 % of the R-R cycle. Diastolic phases were analyzed on  a dedicated work station using MPR, MIP and VRT modes. The patient received 80 cc of contrast. Image quality: good Aorta:  Normal size.  No calcifications.  No dissection. Aortic Valve: No calcifications. Coronary Arteries:  Normal coronary origin.  Right dominance. RCA is a large dominant artery that gives rise to PDA and PLA. There is no plaque. Left main is a large artery that gives rise to LAD and LCX arteries. LAD is a large vessel that has small focal calcified proximal plaque, no stenosis. - 4 small caliber diagonal branches, no plaque. LCX is a non-dominant artery.  There is no plaque. - OM 1 is moderate sized, no plaque - OM 2 and 3 are small caliber, no plaque. Other findings: Normal pulmonary vein drainage into the left atrium. Normal left atrial appendage without a thrombus. Normal size of the pulmonary artery. Please see radiology report for non cardiac findings. IMPRESSION: 1. Coronary calcium score of 1 (small focal area of LAD). This was 55 percentile for age and sex matched control. 2. Normal coronary origin with right dominance. 3. Very mild calcified plaque focal proximal LAD (calcium score of only 1), no stenosis. Electronically Signed: By: Candee Furbish M.D. On: 10/26/2022 10:36   ECHOCARDIOGRAM COMPLETE  Result Date: 10/21/2022    ECHOCARDIOGRAM REPORT   Patient Name:   Mercedes Beltran Date of Exam: 10/21/2022 Medical Rec #:  [id number]   Height:       65.0 in Accession #:    [id number]  Weight:       168.0 lb Date of Birth:  1966-08-20   BSA:          1.837 m Patient Age:    12 years    BP:           127/73 mmHg Patient Gender: F           HR:           57 bpm. Exam Location:  [place] Procedure: 2D Echo, Cardiac Doppler, Color Doppler and Strain Analysis Indications:    Precordial pain [R07.2 (ICD-10-CM)]; DOE (dyspnea on exertion)                 [  R06.09 (ICD-10-CM)]  History:        Patient has prior history of Echocardiogram examinations, most                 recent 06/30/2019.  Arrythmias:SVT; Signs/Symptoms:Dyspnea.  Sonographer:    Philipp Deputy RDCS Referring Phys: [id number] TESSA N CONTE IMPRESSIONS  1. Left ventricular ejection fraction, by estimation, is 60 to 65%. The left ventricle has normal function. The left ventricle has no regional wall motion abnormalities. Left ventricular diastolic parameters were normal. GLS -19.2%  2. Right ventricular systolic function is normal. The right ventricular size is normal.  3. The mitral valve is normal in structure. No evidence of mitral valve regurgitation. No evidence of mitral stenosis.  4. The aortic valve is normal in structure. Aortic valve regurgitation is not visualized. No aortic stenosis is present.  5. The inferior vena cava is normal in size with greater than 50% respiratory variability, suggesting right atrial pressure of 3 mmHg. FINDINGS  Left Ventricle: Left ventricular ejection fraction, by estimation, is 60 to 65%. The left ventricle has normal function. The left ventricle has no regional wall motion abnormalities. The left ventricular internal cavity size was normal in size. There is  no left ventricular hypertrophy. Left ventricular diastolic parameters were normal. Right Ventricle: The right ventricular size is normal. No increase in right ventricular wall thickness. Right ventricular systolic function is normal. Left Atrium: Left atrial size was normal in size. Right Atrium: Right atrial size was normal in size. Pericardium: There is no evidence of pericardial effusion. Mitral Valve: The mitral valve is normal in structure. No evidence of mitral valve regurgitation. No evidence of mitral valve stenosis. Tricuspid Valve: The tricuspid valve is normal in structure. Tricuspid valve regurgitation is not demonstrated. No evidence of tricuspid stenosis. Aortic Valve: The aortic valve is normal in structure. Aortic valve regurgitation is not visualized. No aortic stenosis is present. Pulmonic Valve: The pulmonic valve was normal  in structure. Pulmonic valve regurgitation is not visualized. No evidence of pulmonic stenosis. Aorta: The aortic root is normal in size and structure. Venous: The inferior vena cava is normal in size with greater than 50% respiratory variability, suggesting right atrial pressure of 3 mmHg. IAS/Shunts: No atrial level shunt detected by color flow Doppler.  LEFT VENTRICLE PLAX 2D LVIDd:         3.70 cm   Diastology LVIDs:         2.60 cm   LV e' medial:    10.30 cm/s LV PW:         0.90 cm   LV E/e' medial:  7.6 LV IVS:        1.20 cm   LV e' lateral:   11.10 cm/s LVOT diam:     1.90 cm   LV E/e' lateral: 7.1 LV SV:         87 LV SV Index:   48 LVOT Area:     2.84 cm  RIGHT VENTRICLE             IVC RV Basal diam:  2.50 cm     IVC diam: 2.00 cm RV S prime:     16.60 cm/s TAPSE (M-mode): 2.3 cm LEFT ATRIUM             Index        RIGHT ATRIUM           Index LA diam:        2.90 cm 1.58 cm/m  RA Area:     12.20 cm LA Vol (A2C):   38.2 ml 20.80 ml/m  RA Volume:   24.60 ml  13.39 ml/m LA Vol (A4C):   31.3 ml 17.04 ml/m LA Biplane Vol: 34.5 ml 18.78 ml/m  AORTIC VALVE LVOT Vmax:   128.00 cm/s LVOT Vmean:  95.000 cm/s LVOT VTI:    0.308 m  AORTA Ao Root diam: 3.70 cm Ao Asc diam:  3.70 cm Ao Desc diam: 2.30 cm MITRAL VALVE MV Area (PHT): 3.99 cm    SHUNTS MV Decel Time: 190 msec    Systemic VTI:  0.31 m MV E velocity: 78.50 cm/s  Systemic Diam: 1.90 cm MV A velocity: 81.90 cm/s MV E/A ratio:  0.96 Sunny Schlein Revankar MD Electronically signed by Jyl Heinz MD Signature Date/Time: 10/21/2022/2:44:56 PM    Final     Labs:  CBC: Recent Labs    10/16/22 1054 10/29/22 0700  WBC 7.6 6.1  HGB 15.3* 14.6  HCT 46.1* 44.8  PLT 290 284    COAGS: No results for input(s): "INR", "APTT" in the last 8760 hours.  BMP: Recent Labs    10/12/22 1647 10/16/22 1054  NA 140 141  K 4.2 3.9  CL 103 103  CO2 22 28  GLUCOSE 111* 112*  BUN 10 16  CALCIUM 9.4 9.9  CREATININE 0.74 0.78  GFRNONAA  --  >60     LIVER FUNCTION TESTS: Recent Labs    10/16/22 1054  BILITOT 0.7  AST 27  ALT 48*  ALKPHOS 110  PROT 7.7  ALBUMIN 4.6    TUMOR MARKERS: No results for input(s): "AFPTM", "CEA", "CA199", "CHROMGRNA" in the last 8760 hours.  Assessment and Plan:  Mercedes Beltran is a 57 yo female being seen today for image-guided bone marrow biopsy. The patient was recently diagnosed with small bowel lymphoma. The patient presents today in her usual state of health. She is eager to put this procedure behind her. Case has been reviewed with Dr Earleen Newport and is set to proceed on 10/29/22.   Risks and benefits of image-guided bone marrow biopsy was discussed with the patient and/or patient's family including, but not limited to bleeding, infection, damage to adjacent structures or low yield requiring additional tests.  All of the questions were answered and there is agreement to proceed.  Consent signed and in chart.   Thank you for this interesting consult.  I greatly enjoyed meeting Mercedes Beltran and look forward to participating in their care.  A copy of this report was sent to the requesting provider on this date.  Electronically Signed: Lura Em, PA-C 10/29/2022, 7:55 AM   I spent a total of  40 Minutes   in face to face in clinical consultation, greater than 50% of which was counseling/coordinating care for image-guided bone marrow biopsy.

## 2022-10-29 NOTE — Discharge Instructions (Signed)
Please call Interventional Radiology clinic [phone] with any questions or concerns.  You may remove your dressing and shower tomorrow.  Needle Biopsy of the Bone, Care After This sheet gives you information about how to care for yourself after your procedure. Your health care provider may also give you more specific instructions. If you have problems or questions, contact your health care provider. What can I expect after the procedure? After the procedure, it is common to have soreness or tenderness at the puncture site. Follow these instructions at home: Puncture care A normal biopsy site compared to an infected biopsy site. The infected biopsy site has swelling and redness.  Follow instructions from your health care provider about how to take care of your puncture site. Make sure you: Wash your hands with soap and water before and after you change your bandage (dressing). If soap and water are not available, use hand sanitizer. Change your dressing as told by your health care provider. Check your puncture site every day for signs of infection. Check for: Redness, swelling, or worsening pain. Fluid or blood. Warmth. Pus or a bad smell. General instructions Take over-the-counter and prescription medicines only as told by your health care provider. Do not drive for 24 hours if you were given a sedative during your procedure. Return to your normal activities as told by your health care provider. Keep all follow-up visits as told by your health care provider. This is important. Contact a health care provider if: You have redness, swelling, or worsening pain at the site of your puncture. You have fluid or blood coming from your puncture site. Your puncture site feels warm to the touch. You have pus or a bad smell coming from your puncture site. You have a fever. You have persistent nausea or vomiting. Get help right away if: You develop a rash. You have difficulty  breathing. Summary After the procedure, it is common to have soreness or tenderness at the puncture site. Follow instructions from your health care provider about how to take care of your puncture site. Contact a health care provider if you have any signs of infection. Keep all follow-up visits as told by your health care provider. This is important. This information is not intended to replace advice given to you by your health care provider. Make sure you discuss any questions you have with your health care provider. Document Revised: 10/31/2020 Document Reviewed: 10/31/2020 Elsevier Patient Education  [place].       Moderate Conscious Sedation, Adult, Care After This sheet gives you information about how to care for yourself after your procedure. Your health care provider may also give you more specific instructions. If you have problems or questions, contact your health care provider. What can I expect after the procedure? After the procedure, it is common to have: Sleepiness for several hours. Impaired judgment for several hours. Difficulty with balance. Vomiting if you eat too soon. Follow these instructions at home: For the time period you were told by your health care provider: Rest. Do not participate in activities where you could fall or become injured. Do not drive or use machinery. Do not drink alcohol. Do not take sleeping pills or medicines that cause drowsiness. Do not make important decisions or sign legal documents. Do not take care of children on your own.      Eating and drinking Follow the diet recommended by your health care provider. Drink enough fluid to keep your urine pale yellow. If you vomit: Drink  water, juice, or soup when you can drink without vomiting. Make sure you have little or no nausea before eating solid foods.   General instructions Take over-the-counter and prescription medicines only as told by your health care provider. Have a  responsible adult stay with you for the time you are told. It is important to have someone help care for you until you are awake and alert. Do not smoke. Keep all follow-up visits as told by your health care provider. This is important. Contact a health care provider if: You are still sleepy or having trouble with balance after 24 hours. You feel light-headed. You keep feeling nauseous or you keep vomiting. You develop a rash. You have a fever. You have redness or swelling around the IV site. Get help right away if: You have trouble breathing. You have new-onset confusion at home. Summary After the procedure, it is common to feel sleepy, have impaired judgment, or feel nauseous if you eat too soon. Rest after you get home. Know the things you should not do after the procedure. Follow the diet recommended by your health care provider and drink enough fluid to keep your urine pale yellow. Get help right away if you have trouble breathing or new-onset confusion at home. This information is not intended to replace advice given to you by your health care provider. Make sure you discuss any questions you have with your health care provider. Document Revised: 12/15/2019 Document Reviewed: 07/13/2019 Elsevier Patient Education  2021 Elsevier Inc.  

## 2022-10-29 NOTE — Progress Notes (Signed)
Patient had BMBx today. Will follow for path.   Oncology Nurse Navigator Documentation     10/29/2022   10:00 AM  Oncology Nurse Navigator Flowsheets  Navigator Follow Up Date: 11/03/2022  Navigator Follow Up Reason: Pathology  Navigator Location [facility]  Navigator Encounter Type Appt/Treatment Plan Review  Patient Visit Type MedOnc  Treatment Phase Abnormal Labs  Barriers/Navigation Needs Coordination of Care;Education  Interventions None Required  Acuity Level 2-Minimal Needs (1-2 Barriers Identified)  Support Groups/Services Friends and Family  Time Spent with Patient 15

## 2022-10-29 NOTE — Procedures (Signed)
Interventional Radiology Procedure Note  Procedure:  Image guided aspirate and core biopsy of right posterior iliac bone Complications: None Recommendations: - Bedrest supine x 1 hrs - OTC's PRN  Pain - Follow biopsy results  Signed,  Dulcy Fanny. Earleen Newport, DO

## 2022-10-30 ENCOUNTER — Encounter (HOSPITAL_COMMUNITY)
Admission: RE | Admit: 2022-10-30 | Discharge: 2022-10-30 | Disposition: A | Discharge status: Home / Self Care | Payer: BC Managed Care – PPO | Source: Ambulatory Visit | Attending: Hematology & Oncology | Admitting: Hematology & Oncology

## 2022-10-30 DIAGNOSIS — C8599 Non-Hodgkin lymphoma, unspecified, extranodal and solid organ sites: Secondary | ICD-10-CM | POA: Diagnosis present

## 2022-10-30 LAB — GLUCOSE, CAPILLARY: Glucose-Capillary: 88 mg/dL (ref 70–99)

## 2022-10-30 LAB — SURGICAL PATHOLOGY

## 2022-10-30 MED ORDER — FLUDEOXYGLUCOSE F - 18 (FDG) INJECTION
8.0000 | Freq: Once | INTRAVENOUS | Status: AC
  Administered 2022-10-30: 7.97 via INTRAVENOUS

## 2022-11-03 ENCOUNTER — Encounter: Payer: Self-pay | Admitting: *Deleted

## 2022-11-03 NOTE — Progress Notes (Signed)
Reviewed PET result and BMBx pathology with Dr Marin Olp. No needs at this time. Will follow up with patient at her already scheduled appointment on 11/19/2022.  Oncology Nurse Navigator Documentation     11/03/2022   11:30 AM  Oncology Nurse Navigator Flowsheets  Confirmed Diagnosis Date 09/28/2022  Navigator Follow Up Date: 11/19/2022  Navigator Follow Up Reason: Follow-up Appointment  Navigator Location [facility]  Navigator Encounter Type Scan Review;Pathology Review  Patient Visit Type MedOnc  Treatment Phase Pre-Tx/Tx Discussion  Barriers/Navigation Needs Coordination of Care;Education  Interventions None Required  Acuity Level 2-Minimal Needs (1-2 Barriers Identified)  Support Groups/Services Friends and Family  Time Spent with Patient 15

## 2022-11-09 ENCOUNTER — Encounter (HOSPITAL_COMMUNITY): Payer: Self-pay | Admitting: Hematology & Oncology

## 2022-11-09 NOTE — Progress Notes (Signed)
[facility] Summary Report / Loop Recorder 

## 2022-11-13 ENCOUNTER — Telehealth: Payer: Self-pay

## 2022-11-13 NOTE — Telephone Encounter (Signed)
Patient presented in waiting room requesting a letter from Dr. Marin Olp to be out of work until her appointment on 11/19/2022 due to her SOB and fatigue. Pt stated she told her boss that she was fatigued and SOB and her boss told her to go home yesterday. Pt educated that I did discuss with Dr. Marin Olp but as patient is not under active treatment he did not feel comfortable writing that letter. Pt educated that if she is extremely SOB to go to ER to be evaluated. Pt aware that she can also follow up with her PCP. Pt educated that the FMLA paperwork she dropped off on 11/12/2022 will need up to 7 days to be completed. Pt verbalized understanding and had no further questions.

## 2022-11-18 ENCOUNTER — Encounter: Payer: Self-pay | Admitting: Neurosurgery

## 2022-11-19 ENCOUNTER — Encounter: Payer: Self-pay | Admitting: Hematology & Oncology

## 2022-11-19 ENCOUNTER — Encounter: Payer: Self-pay | Admitting: Neurosurgery

## 2022-11-19 ENCOUNTER — Encounter: Payer: Self-pay | Admitting: *Deleted

## 2022-11-19 ENCOUNTER — Inpatient Hospital Stay: Payer: BC Managed Care – PPO | Attending: Hematology & Oncology

## 2022-11-19 ENCOUNTER — Telehealth: Payer: Self-pay | Admitting: Oncology

## 2022-11-19 ENCOUNTER — Inpatient Hospital Stay (HOSPITAL_BASED_OUTPATIENT_CLINIC_OR_DEPARTMENT_OTHER): Payer: BC Managed Care – PPO | Admitting: Hematology & Oncology

## 2022-11-19 ENCOUNTER — Ambulatory Visit (HOSPITAL_BASED_OUTPATIENT_CLINIC_OR_DEPARTMENT_OTHER)
Admission: RE | Admit: 2022-11-19 | Discharge: 2022-11-19 | Disposition: A | Discharge status: Home / Self Care | Payer: BC Managed Care – PPO | Source: Ambulatory Visit | Attending: Hematology & Oncology | Admitting: Hematology & Oncology

## 2022-11-19 VITALS — BP 133/86 | HR 65 | Temp 98.1°F | Resp 20 | Ht 65.0 in | Wt 161.0 lb

## 2022-11-19 DIAGNOSIS — R0602 Shortness of breath: Secondary | ICD-10-CM | POA: Insufficient documentation

## 2022-11-19 DIAGNOSIS — Z885 Allergy status to narcotic agent status: Secondary | ICD-10-CM | POA: Diagnosis not present

## 2022-11-19 DIAGNOSIS — M25551 Pain in right hip: Secondary | ICD-10-CM | POA: Insufficient documentation

## 2022-11-19 DIAGNOSIS — Z79899 Other long term (current) drug therapy: Secondary | ICD-10-CM | POA: Insufficient documentation

## 2022-11-19 DIAGNOSIS — C8203 Follicular lymphoma grade I, intra-abdominal lymph nodes: Secondary | ICD-10-CM | POA: Insufficient documentation

## 2022-11-19 DIAGNOSIS — K909 Intestinal malabsorption, unspecified: Secondary | ICD-10-CM

## 2022-11-19 DIAGNOSIS — R935 Abnormal findings on diagnostic imaging of other abdominal regions, including retroperitoneum: Secondary | ICD-10-CM

## 2022-11-19 DIAGNOSIS — D500 Iron deficiency anemia secondary to blood loss (chronic): Secondary | ICD-10-CM

## 2022-11-19 DIAGNOSIS — C8599 Non-Hodgkin lymphoma, unspecified, extranodal and solid organ sites: Secondary | ICD-10-CM

## 2022-11-19 DIAGNOSIS — R5383 Other fatigue: Secondary | ICD-10-CM | POA: Insufficient documentation

## 2022-11-19 DIAGNOSIS — R531 Weakness: Secondary | ICD-10-CM | POA: Diagnosis not present

## 2022-11-19 DIAGNOSIS — M2550 Pain in unspecified joint: Secondary | ICD-10-CM | POA: Diagnosis not present

## 2022-11-19 DIAGNOSIS — R079 Chest pain, unspecified: Secondary | ICD-10-CM | POA: Diagnosis not present

## 2022-11-19 LAB — CMP (CANCER CENTER ONLY)
ALT: 32 U/L (ref 0–44)
AST: 20 U/L (ref 15–41)
Albumin: 4.7 g/dL (ref 3.5–5.0)
Alkaline Phosphatase: 111 U/L (ref 38–126)
Anion gap: 9 (ref 5–15)
BUN: 16 mg/dL (ref 6–20)
CO2: 29 mmol/L (ref 22–32)
Calcium: 10 mg/dL (ref 8.9–10.3)
Chloride: 102 mmol/L (ref 98–111)
Creatinine: 0.79 mg/dL (ref 0.44–1.00)
GFR, Estimated: >60 mL/min (ref >60)
Glucose, Bld: 100 mg/dL — ABNORMAL HIGH (ref 70–99)
Potassium: 3.9 mmol/L (ref 3.5–5.1)
Sodium: 140 mmol/L (ref 135–145)
Total Bilirubin: 0.8 mg/dL (ref 0.3–1.2)
Total Protein: 7.7 g/dL (ref 6.5–8.1)

## 2022-11-19 LAB — CBC WITH DIFFERENTIAL (CANCER CENTER ONLY)
Abs Immature Granulocytes: 0.02 10*3/uL (ref 0.00–0.07)
Basophils Absolute: 0.1 10*3/uL (ref 0.0–0.1)
Basophils Relative: 1 %
Eosinophils Absolute: 0.2 10*3/uL (ref 0.0–0.5)
Eosinophils Relative: 2 %
HCT: 47.2 % — ABNORMAL HIGH (ref 36.0–46.0)
Hemoglobin: 15.9 g/dL — ABNORMAL HIGH (ref 12.0–15.0)
Immature Granulocytes: 0 %
Lymphocytes Relative: 36 %
Lymphs Abs: 3.1 10*3/uL (ref 0.7–4.0)
MCH: 30.3 pg (ref 26.0–34.0)
MCHC: 33.7 g/dL (ref 30.0–36.0)
MCV: 89.9 fL (ref 80.0–100.0)
Monocytes Absolute: 0.7 10*3/uL (ref 0.1–1.0)
Monocytes Relative: 9 %
Neutro Abs: 4.4 10*3/uL (ref 1.7–7.7)
Neutrophils Relative %: 52 %
Platelet Count: 298 10*3/uL (ref 150–400)
RBC: 5.25 MIL/uL — ABNORMAL HIGH (ref 3.87–5.11)
RDW: 12.6 % (ref 11.5–15.5)
WBC Count: 8.5 10*3/uL (ref 4.0–10.5)
nRBC: 0 % (ref 0.0–0.2)

## 2022-11-19 LAB — SAVE SMEAR(SSMR), FOR PROVIDER SLIDE REVIEW

## 2022-11-19 LAB — LACTATE DEHYDROGENASE: LDH: 160 U/L (ref 98–192)

## 2022-11-19 MED ORDER — IOHEXOL 350 MG/ML SOLN
80.0000 mL | Freq: Once | INTRAVENOUS | Status: AC | PRN
  Administered 2022-11-19: 80 mL via INTRAVENOUS

## 2022-11-19 NOTE — Progress Notes (Signed)
Hematology and Oncology Follow Up Visit  Shunta Heckstall [id number] [birth year] 57 y.o. 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 Aulakh is back for follow-up.  This is her second office visit.  We first saw her back in February.  At the time, she had a upper endoscopy and this surprisingly showed that area that was biopsied was positive for low-grade follicular non-Hodgkin's lymphoma.  She had a PET scan done on 10/30/2022.  The PET scan showed a focal area of increased within the duodenum which appeared to correspond with the endoscopic findings.  Otherwise, there is no evidence of any disease elsewhere.  She had a bone marrow biopsy done.  This was on 10/29/2022.  The pathology report [id number]) showed no lymphoma.  Her main problem right now has nothing to do with localized lymphoma.  She is just weak.  She is tired.  She is short of breath.  She has right hip pain.  She has seen orthopedic surgery.  Unfortunately, I cannot find any records in the system.  She said they cannot find anything.  I did give her an injection which did not help.  Patient is also having problems with recurrent UTIs.  She says she is post have a cystoscopy in April.  She is having hard time to work.  Again I am not sure as to why she is having a hard time to work.  She has not anemic.  Her labs look okay.  I talked to her about the lymphoma.  Again this appears to be stage I.  This is appears to be low-grade.  I probably would treat her just with radiation therapy.  I think radiation would be a reasonable option for her.  I think she could tolerate this.  She has had no fever.  She has had no bleeding.  There has been no leg swelling.  She has had no rashes.  Overall, I would say that her performance status is probably ECOG 1-2.    Medications:  Current Outpatient Medications:    Biotin 1 MG  CAPS, Take 1 capsule by mouth daily., Disp: , Rfl:    cetirizine (ZYRTEC) 10 MG tablet, Take 10 mg by mouth daily., Disp: , Rfl:    famotidine (PEPCID) 40 MG tablet, Take 40 mg by mouth daily., Disp: , Rfl:    RABEprazole (ACIPHEX) 20 MG tablet, TAKE 2 CAPSULES BY MOUTH ONCE A DAY BEFORE MEALS, Disp: , Rfl:    tiZANidine (ZANAFLEX) 2 MG tablet, Take 1-2 tablets (2-4 mg total) by mouth every 6 (six) hours as needed for muscle spasms., Disp: 60 tablet, Rfl: 1   trimethoprim (TRIMPEX) 100 MG tablet, Take 100 mg by mouth daily., Disp: , Rfl:    estradiol (ESTRACE) 0.1 MG/GM vaginal cream, Place 1 Applicatorful vaginally daily., Disp: , Rfl:    metoprolol tartrate (LOPRESSOR) 100 MG tablet, Take 1 tablet by mouth 2 hours prior to your CT. (Patient not taking: Reported on 11/19/2022), Disp: 1 tablet, Rfl: 0   nitroGLYCERIN (NITROSTAT) 0.4 MG SL tablet, PLACE 1 TABLET (0.4 MG TOTAL) UNDER THE TONGUE EVERY 5 (FIVE) MINUTES AS NEEDED FOR CHEST PAIN., Disp: 25 tablet, Rfl: 3  Allergies:  Allergies  Allergen Reactions   Demerol  [Meperidine Hcl] Nausea And Vomiting   Codeine Nausea And Vomiting   Demerol [Meperidine] Nausea And Vomiting   Morphine And Related  Nausea And Vomiting    Past Medical History, Surgical history, Social history, and Family History were reviewed and updated.  Review of Systems: Review of Systems  Constitutional:  Positive for fatigue.  HENT:  Negative.    Eyes: Negative.   Respiratory:  Positive for shortness of breath.   Cardiovascular:  Positive for chest pain.  Gastrointestinal: Negative.   Endocrine: Negative.   Genitourinary: Negative.    Musculoskeletal:  Positive for arthralgias.  Skin: Negative.   Neurological: Negative.   Hematological: Negative.   Psychiatric/Behavioral: Negative.      Physical Exam:  height is 5\' 5"  (1.651 m) and weight is 161 lb (73 kg). Her oral temperature is 98.1 F (36.7 C). Her blood pressure is 133/86 and her pulse is 65. Her  respiration is 20 and oxygen saturation is 97%.   Wt Readings from Last 3 Encounters:  11/19/22 161 lb (73 kg)  10/29/22 160 lb (72.6 kg)  10/16/22 168 lb (76.2 kg)    Physical Exam Vitals reviewed.  HENT:     Head: Normocephalic and atraumatic.  Eyes:     Pupils: Pupils are equal, round, and reactive to light.  Cardiovascular:     Rate and Rhythm: Normal rate and regular rhythm.     Heart sounds: Normal heart sounds.  Pulmonary:     Effort: Pulmonary effort is normal.     Breath sounds: Normal breath sounds.  Abdominal:     General: Bowel sounds are normal.     Palpations: Abdomen is soft.  Musculoskeletal:        General: No tenderness or deformity. Normal range of motion.     Cervical back: Normal range of motion.  Lymphadenopathy:     Cervical: No cervical adenopathy.  Skin:    General: Skin is warm and dry.     Findings: No erythema or rash.  Neurological:     Mental Status: She is alert and oriented to person, place, and time.  Psychiatric:        Behavior: Behavior normal.        Thought Content: Thought content normal.        Judgment: Judgment normal.      Lab Results  Component Value Date   WBC 8.5 11/19/2022   HGB 15.9 (H) 11/19/2022   HCT 47.2 (H) 11/19/2022   MCV 89.9 11/19/2022   PLT 298 11/19/2022     Chemistry      Component Value Date/Time   NA 140 11/19/2022 0845   NA 140 10/12/2022 1647   NA 143 08/23/2015 0824   K 3.9 11/19/2022 0845   K 4.3 08/23/2015 0824   CL 102 11/19/2022 0845   CL 105 08/23/2015 0824   CO2 29 11/19/2022 0845   CO2 25 08/23/2015 0824   BUN 16 11/19/2022 0845   BUN 10 10/12/2022 1647   BUN 11 08/23/2015 0824   CREATININE 0.79 11/19/2022 0845   CREATININE 0.8 08/23/2015 0824      Component Value Date/Time   CALCIUM 10.0 11/19/2022 0845   CALCIUM 9.4 08/23/2015 0824   ALKPHOS 111 11/19/2022 0845   ALKPHOS 61 08/23/2015 0824   AST 20 11/19/2022 0845   ALT 32 11/19/2022 0845   ALT 30 08/23/2015 0824    BILITOT 0.8 11/19/2022 0845       Impression and Plan: Ms. Brus is a very nice 57 year old white female.  She was found to have a localized lymphoma on upper endoscopy.  I must have this requirement  in usual.  Thankfully, this appears to be localized.  This is low-grade.  I really think that we can consider local radiotherapy for this.  I will speak with Radiation Oncology about this.  I am also going to order a CT angiogram of her chest just to make sure there is nothing going on with her lungs with respect to a thromboembolism.  I just want to make sure that we are not overlooking a cause for this shortness of breath.  Hopefully, she will be able to feel a little bit better.  Hopefully, she will able to get her family doctor to help her out also.  I probably will plan to get her back to see her in about a month or so.     Volanda Napoleon, MD 3/21/202411:18 AM

## 2022-11-19 NOTE — Telephone Encounter (Signed)
Phoned Mrs Delaurentis-instructed her to either upload form she needs for work to [employer] or fax to [place].  Verbalized understanding.

## 2022-11-19 NOTE — Progress Notes (Signed)
Patient staged as a early stage I lymphoma. Dr Marin Olp would like radiation oncology to consult for possible radiation therapy. Order placed.  Patient also c/o severe weakness and SOB. Dr Marin Olp ordered CT angio to r/o PE which was negative.   Oncology Nurse Navigator Documentation     11/19/2022   10:00 AM  Oncology Nurse Navigator Flowsheets  Navigator Follow Up Date: 12/24/2022  Navigator Follow Up Reason: Follow-up Appointment  Navigator Location [facility]  Navigator Encounter Type Appt/Treatment Plan Review  Patient Visit Type MedOnc  Treatment Phase Pre-Tx/Tx Discussion  Barriers/Navigation Needs Coordination of Care;Education  Interventions Referrals  Acuity Level 2-Minimal Needs (1-2 Barriers Identified)  Referrals Radiation Oncology  Support Groups/Services Friends and Family  Time Spent with Patient 15

## 2022-11-20 NOTE — Progress Notes (Signed)
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 Focal area of increased uptake is identified within the horizontal portion of the duodenum which may correspond to the endoscopic and pathologic findings. 2. No signs of tracer avid adenopathy or solid organ involvement. 3. A 3.3 cm left ovarian cyst. Recommend follow-up with pelvic ultrasound in 6-12 months. 4.  Aortic Atherosclerosis (ICD10-I70.0).   Mercedes Beltran presented with symptoms of:  Per Dr. Tama Gander note on 10-16-22 She does have multiple health issues.  She has not been feeling all that well.  She has been followed by gastroenterology for an enlarged liver.  She has she had a MRI of the abdomen back in May 2023.  This does show some hepatomegaly.  Liver that she had hepatic steatosis.   She had a CT of the abdomen pelvis in January of this year.  This also showed the hepatomegaly.  However, nothing else was noted in the abdomen or pelvis.   She has been having some issues with respect to her throat.  She has seen ENT.  She has had a biopsies which have been unremarkable.  Biopsies of small bowel (if applicable) revealed:  10-29-22 Clinical history: lymphoma of small bowel  DIAGNOSIS:  -Predominance of T cells with relative abundance of CD8 positive cells -No monoclonal B-cell population identified -See comment    Past/Anticipated interventions by medical oncology, if any:  Dr. Myna Hidalgo on 11-19-22 -She was found to have a localized lymphoma on upper endoscopy.   -Thankfully, this appears to be localized.  This is low-grade.  I really think that we can consider local radiotherapy for this.  I will speak with Radiation Oncology about this. -I am also going to order a CT angiogram of her chest just to make sure there is nothing going on with her lungs with respect to a thromboembolism.    Weight changes, if any, over the past 6 months: She  reports about 10 pound weight loss over the past 2 months.  Recurrent fevers, or drenching night sweats, if any: She reports night sweats.  Denies fevers.  SAFETY ISSUES: Prior radiation? No Pacemaker/ICD? She has a loop recorder, she see's Dr. Ladona Ridgel Possible current pregnancy? Hysterectomy Is the patient on methotrexate? No  Current Complaints / other details:     History of Present Illness: Mercedes Beltran is a 57 y.o. female with PMH significant for COPD and recently diagnosed small bowel lymphoma being seen today for image-guided bone marrow biopsy. The patient was diagnosed with small bowel lymphoma following a duodenal biopsy performed 09/28/22. The patient is being followed by Dr Myna Hidalgo from Oncology who has referred the patient to IR for bone marrow biopsy.

## 2022-11-23 ENCOUNTER — Ambulatory Visit
Admission: RE | Admit: 2022-11-23 | Discharge: 2022-11-23 | Disposition: A | Discharge status: Home / Self Care | Payer: BC Managed Care – PPO | Source: Ambulatory Visit | Attending: Neurosurgery | Admitting: Neurosurgery

## 2022-11-23 DIAGNOSIS — D320 Benign neoplasm of cerebral meninges: Secondary | ICD-10-CM

## 2022-11-23 MED ORDER — GADOPICLENOL 0.5 MMOL/ML IV SOLN
7.5000 mL | Freq: Once | INTRAVENOUS | Status: AC | PRN
  Administered 2022-11-23: 7.5 mL via INTRAVENOUS

## 2022-11-24 ENCOUNTER — Telehealth: Payer: Self-pay | Admitting: Radiation Oncology

## 2022-11-24 NOTE — Telephone Encounter (Signed)
Spoke to pt's husband who advised [place] to keep the 7:30am appt.

## 2022-11-24 NOTE — Telephone Encounter (Signed)
LVM advising pt of later appt time if she would prefer.

## 2022-11-24 NOTE — Progress Notes (Signed)
Radiation Oncology         [phone] ________________________________  Initial Outpatient Consultation  Name: Mercedes Beltran MRN: [id number]  Date: 11/25/2022  DOB: 1966-01-10  CC:Rhea Bleacher, NP  Volanda Napoleon, MD   REFERRING PHYSICIAN: Volanda Napoleon, MD  DIAGNOSIS: There were no encounter diagnoses.  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 female who is accompanied by ***. she is seen as a courtesy of Dr. Marin Olp for an opinion concerning radiation therapy as part of management for her recently diagnosed follicular lymphoma of the small intestine.   Prior to her recent diagnosis, the patient presented for an MRI of the abdomen this past May 2023 which showed evidence of hepatomegaly. She was also found to have hepatic stenosis and has been followed by GI since that time with repeat imaging studies. Follow-up imaging included a CT AP this past January 2024 (per Dr. Marin Olp) which showed persistent hepatomegaly but no other abnormalities in the abdomen or pelvis.   In January 2024, the patient also developed some throat issues and she was referred to GI at the [facility] for further management. She underwent an upper endoscopy on 09/28/22 which was rather unremarkable. However, biopsies were collected to rule out esophagitis. Duodenal biopsy collected showed evidence of follicular lymphoma (grade 1 lymphoma).   Subsequently, the patient was referred to Dr. Marin Olp on 10/16/22 who recommended proceeding with a bone marrow biopsy to assess for any systemic lymphoma that might be in her bone marrow. Biopsy of the right iliac bone collected on 10/29/22 showed hypercellular bone marrow for her age, with trilineage hematopoiesis, and without significant morphologic abnormalities (no evidence of a B-cell lymphoproliferative process). Flow cytometry came back showing a predominance of T cells with  relative abundance of CD8 positive cells (no monoclonal B-cell population identified). Cytogenic analysis was also ordered and showed a normal karyotype in all cells analyzed.   PET scan performed on 10/30/22 (ordered by Dr. Marin Olp) showed a focal area of increased uptake identified within the horizontal portion of the duodenum, possibly correlating with endoscopic and pathologic findings. PET otherwise showed no evidence of disease elsewhere in the body.   During her most recent follow-up visit with Dr. Marin Olp, the patient denied any symptoms that would correlate with her localized lymphoma. She however endorsed generalized weakness. She also reported feeling short of breath and right hip pain, as well has a recent history of recurrent UTI's, which she is followed by urology for.   For her lymphoma, Dr. Marin Olp recommends localized radiation therapy alone which we will discuss in detail today.   Other pertinent imaging performed thus far includes  -- CTA of the chest on 11/19/22 (in the setting of her recent SOB) which showed advanced centrilobular pulmonary emphysema, but no evidence of pulmonary embolus, pneumonia or other acute cardiopulmonary process. (Evidence of hepatic stenosis was also redemonstrated).  -- MRI of the brain with and without contrast performed on 11/23/22 showed ***.    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   Ovarian cyst    Shortness of breath dyspnea    since 08/2014    PAST SURGICAL HISTORY: Past Surgical History:  Procedure Laterality Date   ABDOMINAL HYSTERECTOMY     CHOLECYSTECTOMY      IR BONE MARROW BIOPSY & ASPIRATION  10/29/2022    FAMILY HISTORY:  Family History  Problem Relation Age of Onset   Breast cancer Paternal Aunt        x6, not sure of ages of onset   Cancer Mother        colon, liver,lung   Heart disease Maternal Grandmother    Cancer Maternal Grandfather     SOCIAL HISTORY:  Social History   Tobacco Use   Smoking status: Former    Packs/day: 1.00    Years: 30.00    Additional pack years: 0.00    Total pack years: 30.00    Types: Cigarettes    Quit date: 02/20/2011    Years since quitting: 11.7   Smokeless tobacco: Never   Tobacco comments:    quit 01/2011  Vaping Use   Vaping Use: Never used  Substance Use Topics   Alcohol use: No    Alcohol/week: 0.0 standard drinks of alcohol   Drug use: Not Currently    ALLERGIES:  Allergies  Allergen Reactions   Demerol  [Meperidine Hcl] Nausea And Vomiting   Codeine Nausea And Vomiting   Demerol [Meperidine] Nausea And Vomiting   Morphine And Related Nausea And Vomiting    MEDICATIONS:  Current Outpatient Medications  Medication Sig Dispense Refill   Biotin 1 MG CAPS Take 1 capsule by mouth daily.     cetirizine (ZYRTEC) 10 MG tablet Take 10 mg by mouth daily.     estradiol (ESTRACE) 0.1 MG/GM vaginal cream Place 1 Applicatorful vaginally daily.     famotidine (PEPCID) 40 MG tablet Take 40 mg by mouth daily.     metoprolol tartrate (LOPRESSOR) 100 MG tablet Take 1 tablet by mouth 2 hours prior to your CT. (Patient not taking: Reported on 11/19/2022) 1 tablet 0   nitroGLYCERIN (NITROSTAT) 0.4 MG SL tablet PLACE 1 TABLET (0.4 MG TOTAL) UNDER THE TONGUE EVERY 5 (FIVE) MINUTES AS NEEDED FOR CHEST PAIN. 25 tablet 3   RABEprazole (ACIPHEX) 20 MG tablet TAKE 2 CAPSULES BY MOUTH ONCE A DAY BEFORE MEALS     tiZANidine (ZANAFLEX) 2 MG tablet Take 1-2 tablets (2-4 mg total) by mouth every 6 (six) hours as needed for muscle spasms. 60 tablet 1   trimethoprim (TRIMPEX) 100 MG tablet Take 100 mg by  mouth daily.     No current facility-administered medications for this encounter.    REVIEW OF SYSTEMS:  A 10+ POINT REVIEW OF SYSTEMS WAS OBTAINED including neurology, dermatology, psychiatry, cardiac, respiratory, lymph, extremities, GI, GU, musculoskeletal, constitutional, reproductive, HEENT. ***   PHYSICAL EXAM:  vitals were not taken for this visit.   General: Alert and oriented, in no acute distress HEENT: Head is normocephalic. Extraocular movements are intact. Oropharynx is clear. Neck: Neck is supple, no palpable cervical or supraclavicular lymphadenopathy. Heart: Regular in rate and rhythm with no murmurs, rubs, or gallops. Chest: Clear to auscultation bilaterally, with no rhonchi, wheezes, or rales. Abdomen: Soft, nontender, nondistended, with no rigidity or guarding. Extremities: No cyanosis or edema. Lymphatics: see Neck Exam Skin: No concerning lesions. Musculoskeletal: symmetric strength and muscle tone throughout. Neurologic: Cranial nerves II through XII are grossly intact. No obvious focalities. Speech is fluent. Coordination  is intact. Psychiatric: Judgment and insight are intact. Affect is appropriate. ***  ECOG = ***  0 - Asymptomatic (Fully active, able to carry on all predisease activities without restriction)  1 - Symptomatic but completely ambulatory (Restricted in physically strenuous activity but ambulatory and able to carry out work of a light or sedentary nature. For example, light housework, office work)  2 - Symptomatic, <50% in bed during the day (Ambulatory and capable of all self care but unable to carry out any work activities. Up and about more than 50% of waking hours)  3 - Symptomatic, >50% in bed, but not bedbound (Capable of only limited self-care, confined to bed or chair 50% or more of waking hours)  4 - Bedbound (Completely disabled. Cannot carry on any self-care. Totally confined to bed or chair)  5 - Death   Eustace Pen MM, Creech RH, Tormey  DC, et al. [phone]). "Toxicity and response criteria of the [facility]". [place] Oncol. 5 (6): 649-55  LABORATORY DATA:  Lab Results  Component Value Date   WBC 8.5 11/19/2022   HGB 15.9 (H) 11/19/2022   HCT 47.2 (H) 11/19/2022   MCV 89.9 11/19/2022   PLT 298 11/19/2022   NEUTROABS 4.4 11/19/2022   Lab Results  Component Value Date   NA 140 11/19/2022   K 3.9 11/19/2022   CL 102 11/19/2022   CO2 29 11/19/2022   GLUCOSE 100 (H) 11/19/2022   BUN 16 11/19/2022   CREATININE 0.79 11/19/2022   CALCIUM 10.0 11/19/2022      RADIOGRAPHY: CT Angio Chest Pulmonary Embolism (PE) W or WO Contrast  Result Date: 11/19/2022 CLINICAL DATA:  57 year old female with a history of non-Hodgkin's lymphoma with dyspnea and chronic chest wall pain. EXAM: CT ANGIOGRAPHY CHEST WITH CONTRAST TECHNIQUE: Multidetector CT imaging of the chest was performed using the standard protocol during bolus administration of intravenous contrast. Multiplanar CT image reconstructions and MIPs were obtained to evaluate the vascular anatomy. RADIATION DOSE REDUCTION: This exam was performed according to the departmental dose-optimization program which includes automated exposure control, adjustment of the mA and/or kV according to patient size and/or use of iterative reconstruction technique. CONTRAST:  62mL OMNIPAQUE IOHEXOL 350 MG/ML SOLN COMPARISON:  Prior PET-CT 10/30/2022 FINDINGS: Cardiovascular: 2 vessel arch anatomy. The right brachiocephalic and left common carotid artery share a common origin. Excellent opacification of the pulmonary arteries to the segmental level. No evidence of acute PE. The heart is normal in size. No pericardial effusion. Mediastinum/Nodes: Unremarkable CT appearance of the thyroid gland. No suspicious mediastinal or hilar adenopathy. No soft tissue mediastinal mass. The thoracic esophagus is unremarkable. Lungs/Pleura: Advanced centrilobular pulmonary emphysema. No  suspicious pulmonary mass or nodule. Mild elevation of the left hemidiaphragm. No pleural effusion or pneumothorax. Upper Abdomen: Geographic hypoattenuation of the hepatic parenchyma consistent with steatosis. No acute abnormality within the visualized upper abdomen. Low-attenuation splenic lesion consistent with a simple cyst or other benign lesion. No hypermetabolic activity visualized on recent PET imaging. The gallbladder is surgically absent. Musculoskeletal: No acute fracture or aggressive appearing lytic or blastic osseous lesion. Review of the MIP images confirms the above findings. IMPRESSION: 1. No evidence of pulmonary embolus, pneumonia or other acute cardiopulmonary process. 2. Advanced centrilobular pulmonary emphysema. 3. Hepatic steatosis. Electronically Signed   By: Jacqulynn Cadet M.D.   On: 11/19/2022 12:23   NM PET Image Initial (PI) Skull Base To Thigh  Result Date: 10/31/2022 CLINICAL DATA:  Initial treatment strategy for small bowel  lymphoma. EXAM: NUCLEAR MEDICINE PET SKULL BASE TO THIGH TECHNIQUE: 7.97 mCi F-18 FDG was injected intravenously. Full-ring PET imaging was performed from the skull base to thigh after the radiotracer. CT data was obtained and used for attenuation correction and anatomic localization. Fasting blood glucose: 88 mg/dl COMPARISON:  CT AP 09/15/2022 FINDINGS: Mediastinal blood pool activity: SUV max 2.75 Liver activity: SUV max 3.54 NECK: No hypermetabolic lymph nodes in the neck. Incidental CT findings: None. CHEST: No hypermetabolic mediastinal or hilar nodes. No suspicious pulmonary nodules on the CT scan. Incidental CT findings: Moderate emphysema. Aortic atherosclerosis. Loop recorder is identified within the ventral left chest wall ABDOMEN/PELVIS: No abnormal tracer activity within the liver, pancreas, spleen, or adrenal glands. No tracer avid abdominopelvic adenopathy identified. Focal area of increased uptake along the horizontal portion of the duodenum  has an SUV max of 5.55 which may correspond to the endoscopic and pathologic findings, image 124 of the fused PET-CT images. Incidental CT findings: Aortic atherosclerosis. Hepatic steatosis. Simple appearing cyst within the spleen is non tracer avid measuring 2.3 cm. There is no splenomegaly. Cyst in the left ovary measures 3.3 cm without abnormal increased uptake. SKELETON: No focal hypermetabolic activity to suggest skeletal metastasis. Incidental CT findings: None. IMPRESSION: 1. Focal area of increased uptake is identified within the horizontal portion of the duodenum which may correspond to the endoscopic and pathologic findings. 2. No signs of tracer avid adenopathy or solid organ involvement. 3. A 3.3 cm left ovarian cyst. Recommend follow-up with pelvic ultrasound in 6-12 months. 4.  Aortic Atherosclerosis (ICD10-I70.0). Electronically Signed   By: Kerby Moors M.D.   On: 10/31/2022 13:29   IR BONE MARROW BIOPSY & ASPIRATION  Result Date: 10/29/2022 INDICATION: 57 year old female referred for bone marrow biopsy EXAM: IR BONE MARRO BIOPY AND ASPIRATION MEDICATIONS: None. ANESTHESIA/SEDATION: Moderate (conscious) sedation was employed during this procedure. A total of Versed 2.0 mg and Fentanyl 100 mcg was administered intravenously. Moderate Sedation Time: 10 minutes. The patient's level of consciousness and vital signs were monitored continuously by radiology nursing throughout the procedure under my direct supervision. FLUOROSCOPY TIME:  Fluoroscopy Time: 0 minutes 15 seconds (2 mGy). COMPLICATIONS: None PROCEDURE: Informed written consent was obtained from the patient after a thorough discussion of the procedural risks, benefits and alternatives. All questions were addressed. Maximal Sterile Barrier Technique was utilized including caps, mask, sterile gowns, sterile gloves, sterile drape, hand hygiene and skin antiseptic. A timeout was performed prior to the initiation of the procedure. Patient was  positioned prone under the image intensifier. Physical exam was used to determine the L5- S1 level, and then the posterior pelvis was prepped with Chlorhexidine in a sterile fashion. A sterile drape was applied covering the operative field. Sterile gown/sterile gloves were used for the procedure. Local anesthesia was provided with 1% Lidocaine. Posterior right iliac bone was targeted for biopsy. The skin and subcutaneous tissues were infiltrated with 1% lidocaine without epinephrine. A small stab incision was made with an 11 blade scalpel, and an 11 gauge Murphy needle was advanced with fluoro guidance to the posterior cortex. Manual forced was used to advance the needle through the posterior cortex and the stylet was removed. A bone marrow aspirate was retrieved and passed to a cytotechnologist in the room. The Murphy needle was then advanced without the stylet for a core biopsy. The core biopsy was retrieved and also passed to a cytotechnologist. Manual pressure was used for hemostasis and a sterile dressing was placed. No complications were  encountered no significant blood loss was encountered. Patient tolerated the procedure well and remained hemodynamically stable throughout. IMPRESSION: Status post image guided bone marrow biopsy. Signed, Dulcy Fanny. Nadene Rubins, RPVI [facility] Electronically Signed   By: Corrie Mckusick D.O.   On: 10/29/2022 09:17   CT CORONARY MORPH W/CTA COR W/SCORE W/CA W/CM &/OR WO/CM  Addendum Date: 10/27/2022   ADDENDUM REPORT: 10/27/2022 12:50 EXAM: OVER-READ INTERPRETATION  CT CHEST The following report is an over-read performed by radiologist Dr. Mina Marble [facility] on 10/27/2022. This over-read does not include interpretation of cardiac or coronary anatomy or pathology. The coronary CTA interpretation by the cardiologist is attached. COMPARISON:  Chest CT 01/21/2022 FINDINGS: Vascular:The included  aorta is normal in caliber. Mediastinum/nodes: No adenopathy or mass.  Tiny hiatal hernia. Lungs: Emphysema. Subsegmental bandlike atelectasis or scarring within the right middle lobe, left lower lobe and lingula, unchanged from prior exam. No acute airspace disease. No pleural fluid. The included airways are patent. Upper abdomen: No acute findings. Stable 2.6 cm water density lesion arising from the anterior spleen consistent with cyst, previously characterized on MRI as cyst. Hepatic steatosis suspected, although not optimally assessed due to phase of contrast. Musculoskeletal: There are no acute or suspicious osseous abnormalities. IMPRESSION: 1. Emphysema. 2. Stable splenic cyst. 3. Tiny hiatal hernia. Electronically Signed   By: Keith Rake M.D.   On: 10/27/2022 12:50   Result Date: 10/27/2022 CLINICAL DATA:  57 year old with chest pain. EXAM: Cardiac/Coronary  CTA TECHNIQUE: The patient was scanned on a [employer]. FINDINGS: A 80 kV prospective scan was triggered in the descending thoracic aorta at 111 HU's. Axial non-contrast 3 mm slices were carried out through the heart. The data set was analyzed on a dedicated work station and scored using the Elsie. Gantry rotation speed was 250 msecs and collimation was .6 mm. 0.8 mg of sl NTG was given. The 3D data set was reconstructed in 5% intervals of the 67-82 % of the R-R cycle. Diastolic phases were analyzed on a dedicated work station using MPR, MIP and VRT modes. The patient received 80 cc of contrast. Image quality: good Aorta:  Normal size.  No calcifications.  No dissection. Aortic Valve: No calcifications. Coronary Arteries:  Normal coronary origin.  Right dominance. RCA is a large dominant artery that gives rise to PDA and PLA. There is no plaque. Left main is a large artery that gives rise to LAD and LCX arteries. LAD is a large vessel that has small focal calcified proximal plaque, no stenosis. - 4 small caliber diagonal  branches, no plaque. LCX is a non-dominant artery.  There is no plaque. - OM 1 is moderate sized, no plaque - OM 2 and 3 are small caliber, no plaque. Other findings: Normal pulmonary vein drainage into the left atrium. Normal left atrial appendage without a thrombus. Normal size of the pulmonary artery. Please see radiology report for non cardiac findings. IMPRESSION: 1. Coronary calcium score of 1 (small focal area of LAD). This was 23 percentile for age and sex matched control. 2. Normal coronary origin with right dominance. 3. Very mild calcified plaque focal proximal LAD (calcium score of only 1), no stenosis. Electronically Signed: By: Candee Furbish M.D. 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  We discussed the natural history of *** and general treatment, highlighting the role of radiotherapy in the management.  We discussed the available radiation techniques, and focused on the details of logistics and delivery.  We reviewed the anticipated acute and late sequelae associated with radiation in this setting.  The patient was encouraged to ask questions that I answered to the best of my ability. *** A patient consent form was discussed and signed.  We retained a copy for our records.  The patient would like to proceed with radiation and will be scheduled for CT simulation.  PLAN: ***    *** minutes of total time was spent for this patient encounter, including preparation, face-to-face counseling with the patient and coordination of care, physical exam, and documentation of the encounter.   ------------------------------------------------  Blair Promise, PhD, MD  This document serves as a record of services personally performed by Gery Pray, MD. It was created on his behalf by Roney Mans, a trained medical scribe. The creation of this record  is based on the scribe's personal observations and the provider's statements to them. This document has been checked and approved by the attending provider.

## 2022-11-25 ENCOUNTER — Ambulatory Visit
Admission: RE | Admit: 2022-11-25 | Discharge: 2022-11-25 | Disposition: A | Discharge status: Home / Self Care | Payer: BC Managed Care – PPO | Source: Ambulatory Visit | Attending: Radiation Oncology | Admitting: Radiation Oncology

## 2022-11-25 ENCOUNTER — Encounter: Payer: Self-pay | Admitting: Radiation Oncology

## 2022-11-25 VITALS — BP 122/74 | HR 59 | Temp 96.4°F | Resp 18 | Ht 65.0 in | Wt 162.2 lb

## 2022-11-25 DIAGNOSIS — Z803 Family history of malignant neoplasm of breast: Secondary | ICD-10-CM | POA: Insufficient documentation

## 2022-11-25 DIAGNOSIS — Z87891 Personal history of nicotine dependence: Secondary | ICD-10-CM | POA: Diagnosis not present

## 2022-11-25 DIAGNOSIS — C8200 Follicular lymphoma grade I, unspecified site: Secondary | ICD-10-CM | POA: Diagnosis not present

## 2022-11-25 DIAGNOSIS — M25551 Pain in right hip: Secondary | ICD-10-CM | POA: Diagnosis not present

## 2022-11-25 DIAGNOSIS — R531 Weakness: Secondary | ICD-10-CM | POA: Insufficient documentation

## 2022-11-25 DIAGNOSIS — K219 Gastro-esophageal reflux disease without esophagitis: Secondary | ICD-10-CM | POA: Insufficient documentation

## 2022-11-25 DIAGNOSIS — C8299 Follicular lymphoma, unspecified, extranodal and solid organ sites: Secondary | ICD-10-CM

## 2022-11-25 DIAGNOSIS — J432 Centrilobular emphysema: Secondary | ICD-10-CM | POA: Insufficient documentation

## 2022-11-25 DIAGNOSIS — K760 Fatty (change of) liver, not elsewhere classified: Secondary | ICD-10-CM | POA: Insufficient documentation

## 2022-11-25 DIAGNOSIS — Z801 Family history of malignant neoplasm of trachea, bronchus and lung: Secondary | ICD-10-CM | POA: Diagnosis not present

## 2022-11-25 DIAGNOSIS — I700 Atherosclerosis of aorta: Secondary | ICD-10-CM | POA: Insufficient documentation

## 2022-11-25 DIAGNOSIS — Z87440 Personal history of urinary (tract) infections: Secondary | ICD-10-CM | POA: Diagnosis not present

## 2022-11-26 ENCOUNTER — Other Ambulatory Visit: Payer: Self-pay

## 2022-11-26 ENCOUNTER — Ambulatory Visit
Admission: RE | Admit: 2022-11-26 | Discharge: 2022-11-26 | Disposition: A | Discharge status: Home / Self Care | Payer: BC Managed Care – PPO | Source: Ambulatory Visit | Attending: Radiation Oncology | Admitting: Radiation Oncology

## 2022-11-26 DIAGNOSIS — C8599 Non-Hodgkin lymphoma, unspecified, extranodal and solid organ sites: Secondary | ICD-10-CM | POA: Diagnosis present

## 2022-11-26 DIAGNOSIS — C8200 Follicular lymphoma grade I, unspecified site: Secondary | ICD-10-CM

## 2022-11-26 NOTE — Progress Notes (Addendum)
Office Visit    Patient Name: Mercedes Beltran Date of Encounter: 11/27/2022  PCP:  Rhea Bleacher, NP   [place]  Cardiologist:  Freada Bergeron, MD  Advanced Practice Provider:  No care team member to display Electrophysiologist:  None   HPI    Mercedes Beltran is a 57 y.o. female with a past medical history significant for hyperlipidemia, family history of premature CAD, COPD, coronary CTA 2018 (Ca score 0, no CAD and follow-up CTA 10/2019 with Ca score 0, no CAD), palpitations presents today for overdue follow-up visit.  She was last seen by Richardson Dopp, [place] 02/19/2020 and at that time she was having symptoms of fatigue.  She was previously seen by Dr. Meda Coffee a few months prior and was having palpitations.  Cardiac monitor showed 5 very short burst of SVT and she was started on flecainide in addition to her metoprolol.  Eventually, flecainide and metoprolol were stopped due to heart rate of 45.  She was asked to follow-up with cardiology at this point.  Her electrocardiograms were reviewed.  It is evident that she was in sinus bradycardia with a heart rate of 45.  Labs were noted without any significant abnormalities.  Patient noted that she took flecainide along with metoprolol for about 3 to 4 months.  It made her feel fatigued.  She stopped several weeks prior to her last appointment.  She did feel better off these medications.  She was off her medications when she saw her PCP and was still tired.  At her last appointment, she felt better.  No recurrent palpitations.  She is not having syncope.  Although, she describes an episode of syncope about a year prior and did not have any prodrome with this.  Occurred while walking from 1 room to the next.  She did endorse occasional chest discomfort which she attributed to acid reflux.  She takes an antacid which helps.  She does have some dependent leg edema.  She was seen by me 2/12, she stated that she is having chest  pain. It occurs at different times. She says it feeling like something is sitting on her chest. She does not do much physical activity so unsure if the pain is exertional. She works as a Designer, industrial/product in a prison. She does get SOB with some activity like climbing the stairs at work. She has had an upper GI workup done and states her WBC are high. She feels tired. She is euvolemic on exam today. She did have to take nitro one. She states the pain has gotten worse over the past few weeks. She does have palpitations but they have always been there and are not a big concern to her at this time. She does have a loop recorded in place. She sees Dr. Lovena Le. She felt bad on flecainide and metoprolol so she is off these medications  Today, she continues to have palpitations but they are short-lived.  She states that her loop recorder does not pick up on it because they are so short-lived.  No recent chest pain.  Some shortness of breath which is chronic and exertional.  Some swelling in her legs occasionally at the end of the day but resolves when she puts them up at night.  We reviewed her most recent echocardiogram and coronary CT scan.  She was at the cancer center and ended up getting a CTA to rule out pulmonary embolism.  The study was negative thankfully.  We discussed that her shortness of breath is likely related to her lungs and not her heart.  She is not on any long-acting bronchodilator or inhaled corticosteroid.  We discussed bringing this up to her primary care doctor when she next has an appointment.  We did give her as needed Xopenex to use for her shortness of breath in the meantime.  She will need to follow-up with Dr. Lovena Le for her loop recorder and continues to do remote check-in's.  She is bradycardic today with heart rate of 46 but she is asymptomatic.  She is not on any nodal blocking agents.  Reports no chest pain, pressure, or tightness. No orthopnea, PND.  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   Ovarian cyst    Shortness of breath dyspnea    since 08/2014   Past Surgical History:  Procedure Laterality Date   ABDOMINAL HYSTERECTOMY     CHOLECYSTECTOMY     IR BONE MARROW BIOPSY & ASPIRATION  10/29/2022    Allergies  Allergies  Allergen Reactions   Demerol  [Meperidine Hcl] Nausea And Vomiting   Codeine Nausea And Vomiting   Demerol [Meperidine] Nausea And Vomiting   Morphine And Related Nausea And Vomiting     EKGs/Labs/Other Studies Reviewed:   The following studies were reviewed today:  ETT 11/21/2019 Low risk, no ischemia   Cardiac Monitor 08/2019 Sinus bradycardia to sinus tachycardia. Five episodes of very short SVTs, the longest lasting 18 beats. No atrial fibrillation. Symptoms don't correlate with arrhythmias.   Echocardiogram 06/30/2019 EF 60-65, no RWMA, normal RVSF   Coronary CTA 10/19/2019 Ca score = 0 No evidence of CAD  EKG:  EKG is ordered today.  Sinus bradycardia, rate 46 bpm.  Recent Labs: 11/19/2022: ALT 32; BUN 16; Creatinine 0.79; Hemoglobin 15.9; Platelet Count 298; Potassium 3.9; Sodium 140  Recent Lipid Panel No results found for: "CHOL", "TRIG", "HDL", "CHOLHDL", "VLDL", "LDLCALC", "LDLDIRECT"   Home Medications   Current Meds  Medication Sig   Biotin 1 MG CAPS Take 1 capsule by mouth daily.   cetirizine (ZYRTEC) 10 MG tablet Take 10 mg by mouth daily.   famotidine (PEPCID) 40 MG tablet Take 40 mg by mouth daily.   levalbuterol (XOPENEX HFA) 45 MCG/ACT inhaler Inhale 1-2 puffs into the lungs every 6 (six) hours as needed for wheezing or shortness of breath. Further refills to be  authorized by PCP   nitroGLYCERIN (NITROSTAT) 0.4 MG SL tablet Place 0.4 mg under the tongue every 5 (five) minutes as needed for chest pain.   RABEprazole (ACIPHEX) 20 MG tablet TAKE 2 CAPSULES BY MOUTH ONCE A DAY BEFORE MEALS   tiZANidine (ZANAFLEX) 2 MG tablet Take 1-2 tablets (2-4 mg total) by mouth every 6 (six) hours as needed for muscle spasms.   trimethoprim (TRIMPEX) 100 MG tablet Take 100 mg by mouth daily.     Review of Systems      All other systems reviewed and are otherwise negative except as noted above.  Physical Exam    VS:  BP 112/72   Pulse Marland Kitchen)  46   Ht 5\' 5"  (1.651 m)   Wt 161 lb 3.2 oz (73.1 kg)   SpO2 96%   BMI 26.83 kg/m  , BMI Body mass index is 26.83 kg/m.  Wt Readings from Last 3 Encounters:  11/27/22 161 lb 3.2 oz (73.1 kg)  11/25/22 162 lb 4 oz (73.6 kg)  11/19/22 161 lb (73 kg)     GEN: Well nourished, well developed, in no acute distress. HEENT: normal. Neck: Supple, no JVD, carotid bruits, or masses. Cardiac: RRR, no murmurs, rubs, or gallops. No clubbing, cyanosis, edema.  Radials/PT 2+ and equal bilaterally.  Respiratory:  Respirations regular and unlabored, clear to auscultation bilaterally. GI: Soft, nontender, nondistended. MS: No deformity or atrophy. Skin: Warm and dry, no rash. Neuro:  Strength and sensation are intact. Psych: Normal affect.  Assessment & Plan    Chest pain-resolved -continue ASA 81mg , nitro as needed -Echocardiogram and coronary CTA reviewed with the patient today -Plan to continue current medications for now  SOB -This is her primary concern -We have ruled out cardiac causes -We discussed getting with her primary care to start her on an inhaled corticosteroid/long-acting bronchodilator -We started her on Xopenex as needed for shortness of breath and wheezing  Palpitations -wearing a loop recorder -She sees Dr. Raiford Noble arrange a follow-up appointment -She is still experiencing these maybe twice a  week  Bradycardia -HR in the 40s, can be monitored via loop recorder -continue current medications -off metoprolol  GERD -recent workup through GI -on multiple medications    Disposition: Follow up 6 months with Freada Bergeron, MD or APP. 2 weeks with PCP 2-3 months with Dr. Lovena Le  Signed, Elgie Collard, PA-C 11/27/2022, 9:18 AM [place]

## 2022-11-27 ENCOUNTER — Ambulatory Visit: Payer: BC Managed Care – PPO | Attending: Physician Assistant | Admitting: Physician Assistant

## 2022-11-27 ENCOUNTER — Encounter: Payer: Self-pay | Admitting: Physician Assistant

## 2022-11-27 VITALS — BP 112/72 | HR 46 | Ht 65.0 in | Wt 161.2 lb

## 2022-11-27 DIAGNOSIS — I471 Supraventricular tachycardia, unspecified: Secondary | ICD-10-CM

## 2022-11-27 DIAGNOSIS — R079 Chest pain, unspecified: Secondary | ICD-10-CM

## 2022-11-27 DIAGNOSIS — R5383 Other fatigue: Secondary | ICD-10-CM | POA: Diagnosis not present

## 2022-11-27 DIAGNOSIS — R072 Precordial pain: Secondary | ICD-10-CM

## 2022-11-27 DIAGNOSIS — R0609 Other forms of dyspnea: Secondary | ICD-10-CM

## 2022-11-27 MED ORDER — LEVALBUTEROL TARTRATE 45 MCG/ACT IN AERO: 1.0000 | INHALATION_SPRAY | Freq: Four times a day (QID) | RESPIRATORY_TRACT | 0 refills | Status: DC | PRN

## 2022-11-27 NOTE — Patient Instructions (Signed)
Medication Instructions:  1.Start levalbuterol (Xopenex) inhaler, 1-2 puffs every 6 hours as needed for shortness of breath or wheezing. If you need further refills of this, it will need to be ordered by your primary care provider. *If you need a refill on your cardiac medications before your next appointment, please call your pharmacy*  Lab Work: None If you have labs (blood work) drawn today and your tests are completely normal, you will receive your results only by: [facility] (if you have MyChart) OR A paper copy in the mail If you have any lab test that is abnormal or we need to change your treatment, we will call you to review the results.  Follow-Up: At [facility], you and your health needs are our priority.  As part of our continuing mission to provide you with exceptional heart care, we have created designated Provider Care Teams.  These Care Teams include your primary Cardiologist (physician) and Advanced Practice Providers (APPs -  Physician Assistants and Nurse Practitioners) who all work together to provide you with the care you need, when you need it.  Your next appointment:   Schedule a 2 month follow up appointment with Dr Lovena Le for overdue follow up with him to discuss your loop recorder Schedule a 6 month follow up appointment with Dr Johney Frame Call and schedule a 2 week follow up with your primary care provider

## 2022-11-30 ENCOUNTER — Ambulatory Visit (INDEPENDENT_AMBULATORY_CARE_PROVIDER_SITE_OTHER): Payer: BC Managed Care – PPO

## 2022-11-30 DIAGNOSIS — R55 Syncope and collapse: Secondary | ICD-10-CM

## 2022-12-01 ENCOUNTER — Encounter: Payer: Self-pay | Admitting: Hematology & Oncology

## 2022-12-01 LAB — CUP PACEART REMOTE DEVICE CHECK
Date Time Interrogation Session: 20240331230547
Implantable Pulse Generator Implant Date: 20210823

## 2022-12-07 NOTE — Progress Notes (Signed)
[facility] Summary Report / Loop Recorder 

## 2022-12-10 DIAGNOSIS — R222 Localized swelling, mass and lump, trunk: Secondary | ICD-10-CM | POA: Insufficient documentation

## 2022-12-10 DIAGNOSIS — R0602 Shortness of breath: Secondary | ICD-10-CM | POA: Diagnosis not present

## 2022-12-10 DIAGNOSIS — R531 Weakness: Secondary | ICD-10-CM | POA: Insufficient documentation

## 2022-12-10 DIAGNOSIS — M2550 Pain in unspecified joint: Secondary | ICD-10-CM | POA: Diagnosis not present

## 2022-12-10 DIAGNOSIS — R079 Chest pain, unspecified: Secondary | ICD-10-CM | POA: Diagnosis not present

## 2022-12-10 DIAGNOSIS — C8200 Follicular lymphoma grade I, unspecified site: Secondary | ICD-10-CM | POA: Insufficient documentation

## 2022-12-10 DIAGNOSIS — R5383 Other fatigue: Secondary | ICD-10-CM | POA: Diagnosis not present

## 2022-12-11 ENCOUNTER — Encounter: Payer: Self-pay | Admitting: Internal Medicine

## 2022-12-11 ENCOUNTER — Telehealth: Payer: Self-pay

## 2022-12-11 NOTE — Telephone Encounter (Signed)
Victorino Dike from Radiation Oncology wants to know why a loop recorder would need to be watched carefully. She would like the nurse to give her a call back at [phone].

## 2022-12-11 NOTE — Progress Notes (Signed)
TO BE COMPLETED BY RADIATION ONCOLOGIST OFFICE:   Patient Name: Mercedes Beltran   Date of Birth: 1966/08/27   Radiation Oncologist: Dr. Roselind Messier   Site to be Treated: Abdomen   Will x-rays >10 MV be used? No   Will the radiation be >10 cm from the device? Yes  TO BE COMPLETED BY CARDIOLOGIST OFFICE:   Device Information:  Pacemaker []      ICD []  Loop Recorder   Brand: Medtronic: [phone] Model #: [id number] [facility]  Serial Number: [id number]     Date of Placement: 04/22/20  Site of Placement: Center chest   Remote Device Check--Frequency: 31 days    Last Check: 11/29/22  Is the Patient Pacer Dependent?:  Yes []   No []      Not Applicable   Does cardiologist request Radiation Oncology to schedule device testing by vendor for the following:  Prior to the Initiation of Treatments?  Yes []  No [x]  During Treatments?  Yes []  No [x]  Post Radiation Treatments?  Yes []  No [x]   Is device monitoring necessary by vendor/cardiologist team during treatments?  Yes []   No [x]   Is cardiac monitoring by Radiation Oncology nursing necessary during treatments? Yes [x]   No []   Do you recommend device be relocated prior to Radiation Treatment? Yes []   No [x]   **PLEASE LIST ANY NOTES OR SPECIAL REQUESTS:       CARDIOLOGIST SIGNATURE:  Dr. Lewayne Bunting Per Device Clinic Standing Orders, Dorathy Daft  12/11/2022 12:10 PM  **Please route completed form back to Radiation Oncology Nursing and "P [facility] RAD ONC ADMIN", OR send an update if there will be a delay in having form completed by expected start date.  **Call [phone] if you have any questions or do not get an in-basket response from a Radiation Oncology staff member

## 2022-12-14 ENCOUNTER — Other Ambulatory Visit: Payer: Self-pay

## 2022-12-14 ENCOUNTER — Ambulatory Visit
Admission: RE | Admit: 2022-12-14 | Discharge: 2022-12-14 | Disposition: A | Discharge status: Home / Self Care | Payer: BC Managed Care – PPO | Source: Ambulatory Visit | Attending: Radiation Oncology | Admitting: Radiation Oncology

## 2022-12-14 ENCOUNTER — Telehealth: Payer: Self-pay

## 2022-12-14 DIAGNOSIS — C8200 Follicular lymphoma grade I, unspecified site: Secondary | ICD-10-CM

## 2022-12-14 LAB — RAD ONC ARIA SESSION SUMMARY
Course Elapsed Days: 0
Plan Fractions Treated to Date: 1
Plan Prescribed Dose Per Fraction: 1.8 Gy
Plan Total Fractions Prescribed: 15
Plan Total Prescribed Dose: 27 Gy
Reference Point Dosage Given to Date: 1.8 Gy
Reference Point Session Dosage Given: 1.8 Gy
Session Number: 1

## 2022-12-14 MED ORDER — ONDANSETRON HCL 4 MG PO TABS: 4.0000 mg | ORAL_TABLET | Freq: Three times a day (TID) | ORAL | 1 refills | Status: AC | PRN

## 2022-12-14 NOTE — Telephone Encounter (Signed)
Patient called in to report vomiting since completing first radiation treatment to abdomen. Patient request medication for nausea. Patient requesting medication be sent to [facility] on [address] [place].

## 2022-12-14 NOTE — Addendum Note (Signed)
Encounter addended by: Antony Blackbird, MD on: 12/14/2022 2:43 PM  Actions taken: Order list changed

## 2022-12-14 NOTE — Telephone Encounter (Signed)
Called patient to make aware of new prescription for Zofran sent to listed pharmacy. Per Dr. Roselind Messier patient to take 1 tab 1 hour prior to treatment daily and as needed.

## 2022-12-15 ENCOUNTER — Ambulatory Visit
Admission: RE | Admit: 2022-12-15 | Discharge: 2022-12-15 | Disposition: A | Discharge status: Home / Self Care | Payer: BC Managed Care – PPO | Source: Ambulatory Visit | Attending: Radiation Oncology | Admitting: Radiation Oncology

## 2022-12-15 ENCOUNTER — Other Ambulatory Visit: Payer: Self-pay

## 2022-12-15 DIAGNOSIS — C8200 Follicular lymphoma grade I, unspecified site: Secondary | ICD-10-CM | POA: Diagnosis not present

## 2022-12-15 LAB — RAD ONC ARIA SESSION SUMMARY
Course Elapsed Days: 1
Plan Fractions Treated to Date: 2
Plan Prescribed Dose Per Fraction: 1.8 Gy
Plan Total Fractions Prescribed: 15
Plan Total Prescribed Dose: 27 Gy
Reference Point Dosage Given to Date: 3.6 Gy
Reference Point Session Dosage Given: 1.8 Gy
Session Number: 2

## 2022-12-15 MED ORDER — SONAFINE EX EMUL
1.0000 | Freq: Once | CUTANEOUS | Status: AC
  Administered 2022-12-15: 1 via TOPICAL

## 2022-12-16 ENCOUNTER — Ambulatory Visit
Admission: RE | Admit: 2022-12-16 | Discharge: 2022-12-16 | Disposition: A | Discharge status: Home / Self Care | Payer: BC Managed Care – PPO | Source: Ambulatory Visit | Attending: Radiation Oncology | Admitting: Radiation Oncology

## 2022-12-16 ENCOUNTER — Other Ambulatory Visit: Payer: Self-pay

## 2022-12-16 DIAGNOSIS — C8200 Follicular lymphoma grade I, unspecified site: Secondary | ICD-10-CM | POA: Diagnosis not present

## 2022-12-16 LAB — RAD ONC ARIA SESSION SUMMARY
Course Elapsed Days: 2
Plan Fractions Treated to Date: 3
Plan Prescribed Dose Per Fraction: 1.8 Gy
Plan Total Fractions Prescribed: 15
Plan Total Prescribed Dose: 27 Gy
Reference Point Dosage Given to Date: 5.4 Gy
Reference Point Session Dosage Given: 1.8 Gy
Session Number: 3

## 2022-12-17 ENCOUNTER — Ambulatory Visit
Admission: RE | Admit: 2022-12-17 | Discharge: 2022-12-17 | Disposition: A | Discharge status: Home / Self Care | Payer: BC Managed Care – PPO | Source: Ambulatory Visit | Attending: Radiation Oncology | Admitting: Radiation Oncology

## 2022-12-17 ENCOUNTER — Other Ambulatory Visit: Payer: Self-pay

## 2022-12-17 DIAGNOSIS — C8200 Follicular lymphoma grade I, unspecified site: Secondary | ICD-10-CM | POA: Diagnosis not present

## 2022-12-17 LAB — RAD ONC ARIA SESSION SUMMARY
Course Elapsed Days: 3
Plan Fractions Treated to Date: 4
Plan Prescribed Dose Per Fraction: 1.8 Gy
Plan Total Fractions Prescribed: 15
Plan Total Prescribed Dose: 27 Gy
Reference Point Dosage Given to Date: 7.2 Gy
Reference Point Session Dosage Given: 1.8 Gy
Session Number: 4

## 2022-12-18 ENCOUNTER — Other Ambulatory Visit: Payer: Self-pay

## 2022-12-18 ENCOUNTER — Ambulatory Visit
Admission: RE | Admit: 2022-12-18 | Discharge: 2022-12-18 | Disposition: A | Discharge status: Home / Self Care | Payer: BC Managed Care – PPO | Source: Ambulatory Visit | Attending: Radiation Oncology | Admitting: Radiation Oncology

## 2022-12-18 DIAGNOSIS — C8200 Follicular lymphoma grade I, unspecified site: Secondary | ICD-10-CM | POA: Diagnosis not present

## 2022-12-18 LAB — RAD ONC ARIA SESSION SUMMARY
Course Elapsed Days: 4
Plan Fractions Treated to Date: 5
Plan Prescribed Dose Per Fraction: 1.8 Gy
Plan Total Fractions Prescribed: 15
Plan Total Prescribed Dose: 27 Gy
Reference Point Dosage Given to Date: 9 Gy
Reference Point Session Dosage Given: 1.8 Gy
Session Number: 5

## 2022-12-21 ENCOUNTER — Ambulatory Visit
Admission: RE | Admit: 2022-12-21 | Discharge: 2022-12-21 | Disposition: A | Discharge status: Home / Self Care | Payer: BC Managed Care – PPO | Source: Ambulatory Visit | Attending: Radiation Oncology | Admitting: Radiation Oncology

## 2022-12-21 ENCOUNTER — Other Ambulatory Visit: Payer: Self-pay

## 2022-12-21 DIAGNOSIS — C8200 Follicular lymphoma grade I, unspecified site: Secondary | ICD-10-CM | POA: Diagnosis not present

## 2022-12-21 LAB — RAD ONC ARIA SESSION SUMMARY
Course Elapsed Days: 7
Plan Fractions Treated to Date: 6
Plan Prescribed Dose Per Fraction: 1.8 Gy
Plan Total Fractions Prescribed: 15
Plan Total Prescribed Dose: 27 Gy
Reference Point Dosage Given to Date: 10.8 Gy
Reference Point Session Dosage Given: 1.8 Gy
Session Number: 6

## 2022-12-22 ENCOUNTER — Ambulatory Visit
Admission: RE | Admit: 2022-12-22 | Discharge: 2022-12-22 | Disposition: A | Discharge status: Home / Self Care | Payer: BC Managed Care – PPO | Source: Ambulatory Visit | Attending: Radiation Oncology | Admitting: Radiation Oncology

## 2022-12-22 ENCOUNTER — Other Ambulatory Visit: Payer: Self-pay

## 2022-12-22 DIAGNOSIS — C8200 Follicular lymphoma grade I, unspecified site: Secondary | ICD-10-CM | POA: Diagnosis not present

## 2022-12-22 LAB — RAD ONC ARIA SESSION SUMMARY
Course Elapsed Days: 8
Plan Fractions Treated to Date: 7
Plan Prescribed Dose Per Fraction: 1.8 Gy
Plan Total Fractions Prescribed: 15
Plan Total Prescribed Dose: 27 Gy
Reference Point Dosage Given to Date: 12.6 Gy
Reference Point Session Dosage Given: 1.8 Gy
Session Number: 7

## 2022-12-23 ENCOUNTER — Ambulatory Visit
Admission: RE | Admit: 2022-12-23 | Discharge: 2022-12-23 | Disposition: A | Discharge status: Home / Self Care | Payer: BC Managed Care – PPO | Source: Ambulatory Visit | Attending: Radiation Oncology | Admitting: Radiation Oncology

## 2022-12-23 ENCOUNTER — Other Ambulatory Visit: Payer: Self-pay

## 2022-12-23 DIAGNOSIS — C8200 Follicular lymphoma grade I, unspecified site: Secondary | ICD-10-CM | POA: Diagnosis not present

## 2022-12-23 LAB — RAD ONC ARIA SESSION SUMMARY
Course Elapsed Days: 9
Plan Fractions Treated to Date: 8
Plan Prescribed Dose Per Fraction: 1.8 Gy
Plan Total Fractions Prescribed: 15
Plan Total Prescribed Dose: 27 Gy
Reference Point Dosage Given to Date: 14.4 Gy
Reference Point Session Dosage Given: 1.8 Gy
Session Number: 8

## 2022-12-24 ENCOUNTER — Other Ambulatory Visit: Payer: Self-pay

## 2022-12-24 ENCOUNTER — Inpatient Hospital Stay: Payer: BC Managed Care – PPO

## 2022-12-24 ENCOUNTER — Other Ambulatory Visit: Payer: Self-pay | Admitting: *Deleted

## 2022-12-24 ENCOUNTER — Ambulatory Visit
Admission: RE | Admit: 2022-12-24 | Discharge: 2022-12-24 | Disposition: A | Discharge status: Home / Self Care | Payer: BC Managed Care – PPO | Source: Ambulatory Visit | Attending: Radiation Oncology | Admitting: Radiation Oncology

## 2022-12-24 ENCOUNTER — Encounter: Payer: Self-pay | Admitting: *Deleted

## 2022-12-24 ENCOUNTER — Inpatient Hospital Stay (HOSPITAL_BASED_OUTPATIENT_CLINIC_OR_DEPARTMENT_OTHER): Payer: BC Managed Care – PPO | Admitting: Hematology & Oncology

## 2022-12-24 ENCOUNTER — Encounter: Payer: Self-pay | Admitting: Hematology & Oncology

## 2022-12-24 VITALS — BP 138/73 | HR 51 | Temp 97.9°F | Resp 18 | Wt 158.8 lb

## 2022-12-24 DIAGNOSIS — R079 Chest pain, unspecified: Secondary | ICD-10-CM | POA: Insufficient documentation

## 2022-12-24 DIAGNOSIS — C8200 Follicular lymphoma grade I, unspecified site: Secondary | ICD-10-CM

## 2022-12-24 DIAGNOSIS — K909 Intestinal malabsorption, unspecified: Secondary | ICD-10-CM

## 2022-12-24 DIAGNOSIS — C8599 Non-Hodgkin lymphoma, unspecified, extranodal and solid organ sites: Secondary | ICD-10-CM

## 2022-12-24 DIAGNOSIS — C8203 Follicular lymphoma grade I, intra-abdominal lymph nodes: Secondary | ICD-10-CM | POA: Insufficient documentation

## 2022-12-24 DIAGNOSIS — R0602 Shortness of breath: Secondary | ICD-10-CM

## 2022-12-24 DIAGNOSIS — D500 Iron deficiency anemia secondary to blood loss (chronic): Secondary | ICD-10-CM

## 2022-12-24 DIAGNOSIS — Z885 Allergy status to narcotic agent status: Secondary | ICD-10-CM | POA: Insufficient documentation

## 2022-12-24 DIAGNOSIS — Z79899 Other long term (current) drug therapy: Secondary | ICD-10-CM | POA: Insufficient documentation

## 2022-12-24 DIAGNOSIS — M2550 Pain in unspecified joint: Secondary | ICD-10-CM | POA: Insufficient documentation

## 2022-12-24 DIAGNOSIS — R5383 Other fatigue: Secondary | ICD-10-CM | POA: Insufficient documentation

## 2022-12-24 LAB — CBC WITH DIFFERENTIAL (CANCER CENTER ONLY)
Abs Immature Granulocytes: 0.01 10*3/uL (ref 0.00–0.07)
Basophils Absolute: 0 10*3/uL (ref 0.0–0.1)
Basophils Relative: 1 %
Eosinophils Absolute: 0.4 10*3/uL (ref 0.0–0.5)
Eosinophils Relative: 10 %
HCT: 44.1 % (ref 36.0–46.0)
Hemoglobin: 15.1 g/dL — ABNORMAL HIGH (ref 12.0–15.0)
Immature Granulocytes: 0 %
Lymphocytes Relative: 27 %
Lymphs Abs: 1.2 10*3/uL (ref 0.7–4.0)
MCH: 30.7 pg (ref 26.0–34.0)
MCHC: 34.2 g/dL (ref 30.0–36.0)
MCV: 89.6 fL (ref 80.0–100.0)
Monocytes Absolute: 0.5 10*3/uL (ref 0.1–1.0)
Monocytes Relative: 11 %
Neutro Abs: 2.4 10*3/uL (ref 1.7–7.7)
Neutrophils Relative %: 51 %
Platelet Count: 257 10*3/uL (ref 150–400)
RBC: 4.92 MIL/uL (ref 3.87–5.11)
RDW: 12.4 % (ref 11.5–15.5)
WBC Count: 4.6 10*3/uL (ref 4.0–10.5)
nRBC: 0 % (ref 0.0–0.2)

## 2022-12-24 LAB — RAD ONC ARIA SESSION SUMMARY
Course Elapsed Days: 10
Plan Fractions Treated to Date: 9
Plan Prescribed Dose Per Fraction: 1.8 Gy
Plan Total Fractions Prescribed: 15
Plan Total Prescribed Dose: 27 Gy
Reference Point Dosage Given to Date: 16.2 Gy
Reference Point Session Dosage Given: 1.8 Gy
Session Number: 9

## 2022-12-24 LAB — LACTATE DEHYDROGENASE: LDH: 138 U/L (ref 98–192)

## 2022-12-24 LAB — SAVE SMEAR(SSMR), FOR PROVIDER SLIDE REVIEW

## 2022-12-24 LAB — CMP (CANCER CENTER ONLY)
ALT: 37 U/L (ref 0–44)
AST: 30 U/L (ref 15–41)
Albumin: 4.1 g/dL (ref 3.5–5.0)
Alkaline Phosphatase: 95 U/L (ref 38–126)
Anion gap: 9 (ref 5–15)
BUN: 11 mg/dL (ref 6–20)
CO2: 30 mmol/L (ref 22–32)
Calcium: 9.5 mg/dL (ref 8.9–10.3)
Chloride: 104 mmol/L (ref 98–111)
Creatinine: 0.82 mg/dL (ref 0.44–1.00)
GFR, Estimated: >60 mL/min (ref >60)
Glucose, Bld: 96 mg/dL (ref 70–99)
Potassium: 4 mmol/L (ref 3.5–5.1)
Sodium: 143 mmol/L (ref 135–145)
Total Bilirubin: 0.7 mg/dL (ref 0.3–1.2)
Total Protein: 7 g/dL (ref 6.5–8.1)

## 2022-12-24 LAB — TSH: TSH: 1.418 u[IU]/mL (ref 0.350–4.500)

## 2022-12-24 LAB — VITAMIN B12: Vitamin B-12: 1024 pg/mL — ABNORMAL HIGH (ref 180–914)

## 2022-12-24 NOTE — Progress Notes (Signed)
Hematology and Oncology Follow Up Visit  Mercedes Beltran [id number] [birth year] 57 y.o. 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  She has started radiation therapy to the abdominal lymphoma.  She has had a treatment so far.  I think she has 7 left.  She is doing okay although she still feels very tired.  We will do some additional studies on her today.  I am not sure as to why she does feel tired.  She clearly is not anemic.  I am asked again to send off a JAK2 assay on her.  When we last saw her, her ferritin was 20 with an iron saturation of 33%.  We are going to have to watch this closely.  She has had no bleeding.  There is no change in bowel or bladder habits.  She has had no rashes.  There is been no cough or shortness of breath.  She has had no leg swelling.  She has had no bleeding.  She currently is not working.  I think this is because of her lymphoma and her not feeling well.  Overall, I would have to say that her performance status is ECOG 1.    Medications:  Current Outpatient Medications:    Biotin 1 MG CAPS, Take 1 capsule by mouth daily., Disp: , Rfl:    cetirizine (ZYRTEC) 10 MG tablet, Take 10 mg by mouth daily., Disp: , Rfl:    famotidine (PEPCID) 40 MG tablet, Take 40 mg by mouth daily., Disp: , Rfl:    ondansetron (ZOFRAN) 4 MG tablet, Take 1 tablet (4 mg total) by mouth every 8 (eight) hours as needed for nausea or vomiting., Disp: 30 tablet, Rfl: 1   RABEprazole (ACIPHEX) 20 MG tablet, TAKE 2 CAPSULES BY MOUTH ONCE A DAY BEFORE MEALS, Disp: , Rfl:    tiZANidine (ZANAFLEX) 2 MG tablet, Take 1-2 tablets (2-4 mg total) by mouth every 6 (six) hours as needed for muscle spasms., Disp: 60 tablet, Rfl: 1   trimethoprim (TRIMPEX) 100 MG tablet, Take 100 mg by mouth daily., Disp: , Rfl:    levalbuterol  (XOPENEX HFA) 45 MCG/ACT inhaler, Inhale 1-2 puffs into the lungs every 6 (six) hours as needed for wheezing or shortness of breath. Further refills to be authorized by PCP (Patient not taking: Reported on 12/24/2022), Disp: 1 each, Rfl: 0   nitroGLYCERIN (NITROSTAT) 0.4 MG SL tablet, Place 0.4 mg under the tongue every 5 (five) minutes as needed for chest pain. (Patient not taking: Reported on 12/24/2022), Disp: , Rfl:   Allergies:  Allergies  Allergen Reactions   Demerol  [Meperidine Hcl] Nausea And Vomiting   Codeine Nausea And Vomiting   Demerol [Meperidine] Nausea And Vomiting   Morphine And Related Nausea And Vomiting    Past Medical History, Surgical history, Social history, and Family History were reviewed and updated.  Review of Systems: Review of Systems  Constitutional:  Positive for fatigue.  HENT:  Negative.    Eyes: Negative.   Respiratory:  Positive for shortness of breath.   Cardiovascular:  Positive for chest pain.  Gastrointestinal: Negative.   Endocrine: Negative.   Genitourinary: Negative.    Musculoskeletal:  Positive for arthralgias.  Skin: Negative.   Neurological: Negative.   Hematological: Negative.   Psychiatric/Behavioral: Negative.  Physical Exam:  weight is 158 lb 12 oz (72 kg). Her oral temperature is 97.9 F (36.6 C). Her blood pressure is 138/73 and her pulse is 51 (abnormal). Her respiration is 18 and oxygen saturation is 99%.   Wt Readings from Last 3 Encounters:  12/24/22 158 lb 12 oz (72 kg)  11/27/22 161 lb 3.2 oz (73.1 kg)  11/25/22 162 lb 4 oz (73.6 kg)    Physical Exam Vitals reviewed.  HENT:     Head: Normocephalic and atraumatic.  Eyes:     Pupils: Pupils are equal, round, and reactive to light.  Cardiovascular:     Rate and Rhythm: Normal rate and regular rhythm.     Heart sounds: Normal heart sounds.  Pulmonary:     Effort: Pulmonary effort is normal.     Breath sounds: Normal breath sounds.  Abdominal:     General:  Bowel sounds are normal.     Palpations: Abdomen is soft.  Musculoskeletal:        General: No tenderness or deformity. Normal range of motion.     Cervical back: Normal range of motion.  Lymphadenopathy:     Cervical: No cervical adenopathy.  Skin:    General: Skin is warm and dry.     Findings: No erythema or rash.     Comments: On the middle upper portion of her back, there is a dark nodule.  This probably measures about 3 x 2 mm.  It is nontender.  Neurological:     Mental Status: She is alert and oriented to person, place, and time.  Psychiatric:        Behavior: Behavior normal.        Thought Content: Thought content normal.        Judgment: Judgment normal.      Lab Results  Component Value Date   WBC 4.6 12/24/2022   HGB 15.1 (H) 12/24/2022   HCT 44.1 12/24/2022   MCV 89.6 12/24/2022   PLT 257 12/24/2022     Chemistry      Component Value Date/Time   NA 140 11/19/2022 0845   NA 140 10/12/2022 1647   NA 143 08/23/2015 0824   K 3.9 11/19/2022 0845   K 4.3 08/23/2015 0824   CL 102 11/19/2022 0845   CL 105 08/23/2015 0824   CO2 29 11/19/2022 0845   CO2 25 08/23/2015 0824   BUN 16 11/19/2022 0845   BUN 10 10/12/2022 1647   BUN 11 08/23/2015 0824   CREATININE 0.79 11/19/2022 0845   CREATININE 0.8 08/23/2015 0824      Component Value Date/Time   CALCIUM 10.0 11/19/2022 0845   CALCIUM 9.4 08/23/2015 0824   ALKPHOS 111 11/19/2022 0845   ALKPHOS 61 08/23/2015 0824   AST 20 11/19/2022 0845   ALT 32 11/19/2022 0845   ALT 30 08/23/2015 0824   BILITOT 0.8 11/19/2022 0845       Impression and Plan: Mercedes Beltran is a very nice 57 year old white female.  She was found to have a localized lymphoma on upper endoscopy.  She currently is getting radiotherapy for this since this is so localized.  I will a bit worried about this nodule on her back.  I think this plan is to be looked at.  We will see about making a referral to Dermatology.  She lives in [place] so maybe  we get one of the dermatologist in [place] to see her.  We will see what her labs show with  respect to her fatigue and weakness.  I did state that she feels tired.  It will be interesting to see what her JAK2 study is.  I would like to see her back in 6 weeks.      Josph Macho, MD 4/25/20248:36 AM

## 2022-12-24 NOTE — Progress Notes (Signed)
Patient currently receiving radiation. She is doing well but does complain of significant fatigue.   In review of patient chart, she needs referral to social work and nutrition per protocol. Orders placed.   Oncology Nurse Navigator Documentation     12/24/2022    9:30 AM  Oncology Nurse Navigator Flowsheets  Planned Course of Treatment Radiation  Phase of Treatment Radiation  Radiation Actual Start Date: 12/14/2022  Radiation Expected End Date: 01/01/2023  Navigator Follow Up Date: 02/02/2023  Navigator Follow Up Reason: Follow-up Appointment  Navigator Location [facility]  Navigator Encounter Type Appt/Treatment Plan Review  Treatment Initiated Date 12/14/2022  Patient Visit Type MedOnc  Treatment Phase Active Tx  Barriers/Navigation Needs Coordination of Care;Education  Interventions Referrals  Acuity Level 2-Minimal Needs (1-2 Barriers Identified)  Referrals Nutrition/dietician;Social Work  Support Groups/Services Friends and Family  Time Spent with Patient 15  Genetic Counseling Type None

## 2022-12-24 NOTE — Progress Notes (Signed)
[facility] Clinical Social Work  Clinical Social Work was referred by Statistician for assessment of psychosocial needs per new patient protocol.  Clinical Social Worker contacted patient by phone to offer support and assess for needs.    Patient expressed adjusting to her diagnosis by "taking it a day at a time".  She has a spouse, daughter and friend who are very supportive, as well as, others.  She stated she is fatigued and nauseated and her MD is aware.  She has taken sick days at work and is not utilizing her disability at this time.  She agreed to have CSW mail her Alight program information.     Dorothey Baseman, LCSW  Clinical Social Worker [facility]

## 2022-12-25 ENCOUNTER — Other Ambulatory Visit: Payer: Self-pay

## 2022-12-25 ENCOUNTER — Ambulatory Visit
Admission: RE | Admit: 2022-12-25 | Discharge: 2022-12-25 | Disposition: A | Discharge status: Home / Self Care | Payer: BC Managed Care – PPO | Source: Ambulatory Visit | Attending: Radiation Oncology | Admitting: Radiation Oncology

## 2022-12-25 DIAGNOSIS — C8200 Follicular lymphoma grade I, unspecified site: Secondary | ICD-10-CM | POA: Diagnosis not present

## 2022-12-25 LAB — RAD ONC ARIA SESSION SUMMARY
Course Elapsed Days: 11
Plan Fractions Treated to Date: 10
Plan Prescribed Dose Per Fraction: 1.8 Gy
Plan Total Fractions Prescribed: 15
Plan Total Prescribed Dose: 27 Gy
Reference Point Dosage Given to Date: 18 Gy
Reference Point Session Dosage Given: 1.8 Gy
Session Number: 10

## 2022-12-28 ENCOUNTER — Telehealth: Payer: Self-pay | Admitting: *Deleted

## 2022-12-28 ENCOUNTER — Encounter: Payer: Self-pay | Admitting: *Deleted

## 2022-12-28 ENCOUNTER — Other Ambulatory Visit: Payer: Self-pay

## 2022-12-28 ENCOUNTER — Ambulatory Visit
Admission: RE | Admit: 2022-12-28 | Discharge: 2022-12-28 | Disposition: A | Discharge status: Home / Self Care | Payer: BC Managed Care – PPO | Source: Ambulatory Visit | Attending: Radiation Oncology | Admitting: Radiation Oncology

## 2022-12-28 DIAGNOSIS — D751 Secondary polycythemia: Secondary | ICD-10-CM | POA: Insufficient documentation

## 2022-12-28 DIAGNOSIS — C8200 Follicular lymphoma grade I, unspecified site: Secondary | ICD-10-CM | POA: Diagnosis not present

## 2022-12-28 LAB — RAD ONC ARIA SESSION SUMMARY
Course Elapsed Days: 14
Plan Fractions Treated to Date: 11
Plan Prescribed Dose Per Fraction: 1.8 Gy
Plan Total Fractions Prescribed: 15
Plan Total Prescribed Dose: 27 Gy
Reference Point Dosage Given to Date: 19.8 Gy
Reference Point Session Dosage Given: 1.8 Gy
Session Number: 11

## 2022-12-28 NOTE — Telephone Encounter (Signed)
Referral has been faxed to [facility] [phone]

## 2022-12-29 ENCOUNTER — Other Ambulatory Visit: Payer: Self-pay

## 2022-12-29 ENCOUNTER — Ambulatory Visit
Admission: RE | Admit: 2022-12-29 | Discharge: 2022-12-29 | Disposition: A | Discharge status: Home / Self Care | Payer: BC Managed Care – PPO | Source: Ambulatory Visit | Attending: Radiation Oncology | Admitting: Radiation Oncology

## 2022-12-29 DIAGNOSIS — C8200 Follicular lymphoma grade I, unspecified site: Secondary | ICD-10-CM | POA: Diagnosis not present

## 2022-12-29 LAB — RAD ONC ARIA SESSION SUMMARY
Course Elapsed Days: 15
Plan Fractions Treated to Date: 12
Plan Prescribed Dose Per Fraction: 1.8 Gy
Plan Total Fractions Prescribed: 15
Plan Total Prescribed Dose: 27 Gy
Reference Point Dosage Given to Date: 21.6 Gy
Reference Point Session Dosage Given: 1.8 Gy
Session Number: 12

## 2022-12-30 ENCOUNTER — Encounter: Payer: Self-pay | Admitting: Hematology & Oncology

## 2022-12-30 ENCOUNTER — Ambulatory Visit
Admission: RE | Admit: 2022-12-30 | Discharge: 2022-12-30 | Disposition: A | Discharge status: Home / Self Care | Payer: BC Managed Care – PPO | Source: Ambulatory Visit | Attending: Radiation Oncology | Admitting: Radiation Oncology

## 2022-12-30 ENCOUNTER — Other Ambulatory Visit: Payer: Self-pay

## 2022-12-30 ENCOUNTER — Inpatient Hospital Stay: Payer: BC Managed Care – PPO | Attending: Hematology & Oncology | Admitting: Dietician

## 2022-12-30 DIAGNOSIS — C8200 Follicular lymphoma grade I, unspecified site: Secondary | ICD-10-CM | POA: Insufficient documentation

## 2022-12-30 LAB — RAD ONC ARIA SESSION SUMMARY
Course Elapsed Days: 16
Plan Fractions Treated to Date: 13
Plan Prescribed Dose Per Fraction: 1.8 Gy
Plan Total Fractions Prescribed: 15
Plan Total Prescribed Dose: 27 Gy
Reference Point Dosage Given to Date: 23.4 Gy
Reference Point Session Dosage Given: 1.8 Gy
Session Number: 13

## 2022-12-30 NOTE — Progress Notes (Signed)
Nutrition Assessment: Reached out to patient at home telephone number.    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  She has a PMHx that includes anemia, iron malabsorption, COPD, and heart disease. Reports weight loss was due to no appetite which has been worse since starting radiation.  Foods are tasting like metal( all foods) and she is using plastic ware with little relief. Bowels have been irregular and she's had some cramping, diarrhea (yellow watery prior to radiation), and constipation (yellow and gray) now moving bowels every 3-4 days . Gets full fast and only able to eat about 33% of her usual intake at each feed. No food allergies, doesn't like spicy foods, doesn't eat any processed meats. Foods she likes:  salmon, chicken,  potatoes, broccoli  Premier for breakfast (30 grams protein) Crackers or sandwich for lunch She cooks dinner (protein starch veg) Snacks on crackers(Lance), like bananas and grapes.  Fluids: Flavored water , water 32-48oz.  no coffee or tea now   Nutrition Focused Physical Exam: unable to perform NFPE   Medications: Using Zofran for nausea, not getting much relief, taking stool softener daily   Labs: reviewed 11/2522 no nutritional concerns   Anthropometrics: lost 8# (4.7%)in less than month in February  Height: 65" Weight:  12/24/22  158.8# 11/27/22 161# 10/29/22 160# 10/16/22 168# UBW: 160-165# BMI: 26.42   Estimated Energy Needs  Kcals: [address] Protein: 88-110 g Fluid: 2.5 L   NUTRITION DIAGNOSIS: Inadequate intake r/t NIS anorexia, dysgeusia, constipation, and early satiety to meet needs  INTERVENTION:   Relayed that nutrition services are wrap around service provided at no charge and encouraged continued communication if experiencing any nutritional impact  symptoms (NIS). Educated on importance of adequate nourishment with calorie and protein energy intake  with nutrient dense foods when possible to maintain weight/strength and QOL.   Discussed ways to address nausea and constipation. Encouraged increasing fluids to 60-80oz, and warm prune juice daily (patient preference) Suggested ginger products for nausea.  Encouraged continued use of plastic ware and baking soda swishes. Emailed Nutrition Tip sheet  for  Nausea and Constipation with contact information provided.  MONITORING, EVALUATION, GOAL: weight trends, nutrition impact symptoms, PO intake, labs  Goal is weight maintenance.  Next Visit: Remote prior to returning back to work.  Gennaro Africa, RDN, LDN Registered Dietitian, [facility] Part Time Remote (Usual office hours: Tuesday-Thursday) Mobile: [phone]

## 2022-12-31 ENCOUNTER — Other Ambulatory Visit: Payer: Self-pay

## 2022-12-31 ENCOUNTER — Ambulatory Visit
Admission: RE | Admit: 2022-12-31 | Discharge: 2022-12-31 | Disposition: A | Discharge status: Home / Self Care | Payer: BC Managed Care – PPO | Source: Ambulatory Visit | Attending: Radiation Oncology | Admitting: Radiation Oncology

## 2022-12-31 DIAGNOSIS — C8200 Follicular lymphoma grade I, unspecified site: Secondary | ICD-10-CM | POA: Diagnosis not present

## 2022-12-31 LAB — RAD ONC ARIA SESSION SUMMARY
Course Elapsed Days: 17
Plan Fractions Treated to Date: 14
Plan Prescribed Dose Per Fraction: 1.8 Gy
Plan Total Fractions Prescribed: 15
Plan Total Prescribed Dose: 27 Gy
Reference Point Dosage Given to Date: 25.2 Gy
Reference Point Session Dosage Given: 1.8 Gy
Session Number: 14

## 2023-01-01 ENCOUNTER — Other Ambulatory Visit: Payer: Self-pay

## 2023-01-01 ENCOUNTER — Ambulatory Visit
Admission: RE | Admit: 2023-01-01 | Discharge: 2023-01-01 | Disposition: A | Discharge status: Home / Self Care | Payer: BC Managed Care – PPO | Source: Ambulatory Visit | Attending: Radiation Oncology | Admitting: Radiation Oncology

## 2023-01-01 DIAGNOSIS — C8200 Follicular lymphoma grade I, unspecified site: Secondary | ICD-10-CM | POA: Diagnosis not present

## 2023-01-01 LAB — RAD ONC ARIA SESSION SUMMARY
Course Elapsed Days: 18
Plan Fractions Treated to Date: 15
Plan Prescribed Dose Per Fraction: 1.8 Gy
Plan Total Fractions Prescribed: 15
Plan Total Prescribed Dose: 27 Gy
Reference Point Dosage Given to Date: 27 Gy
Reference Point Session Dosage Given: 1.8 Gy
Session Number: 15

## 2023-01-01 LAB — JAK2 (INCLUDING V617F AND EXON 12), MPL,& CALR-NEXT GEN SEQ

## 2023-01-04 ENCOUNTER — Ambulatory Visit (INDEPENDENT_AMBULATORY_CARE_PROVIDER_SITE_OTHER): Payer: BC Managed Care – PPO

## 2023-01-04 DIAGNOSIS — R55 Syncope and collapse: Secondary | ICD-10-CM

## 2023-01-04 LAB — CUP PACEART REMOTE DEVICE CHECK
Date Time Interrogation Session: 20240503230355
Implantable Pulse Generator Implant Date: 20210823

## 2023-01-06 NOTE — Radiation Completion Notes (Signed)
Patient Name: Mercedes Beltran, Mercedes Beltran MRN: [id number] Date of Birth: 22-Feb-1966 Referring Physician: Arlan Organ, M.D. Date of Service: 2023-01-06 Radiation Oncologist: Arnette Schaumann, M.D. [facility]                             RADIATION ONCOLOGY END OF TREATMENT NOTE     Diagnosis: C85.99 Non-Hodgkin lymphoma, unspecified, extranodal and solid organ sites Intent: Curative     ==========DELIVERED PLANS==========  First Treatment Date: 2022-12-14 - Last Treatment Date: 2023-01-01   Plan Name: Abd_Duodenum Site: Duodenum Technique: IMRT Mode: Photon Dose Per Fraction: 1.8 Gy Prescribed Dose (Delivered / Prescribed): 27 Gy / 27 Gy Prescribed Fxs (Delivered / Prescribed): 15 / 15     ==========ON TREATMENT VISIT DATES========== 2022-12-15, 2022-12-22, 2022-12-29     ==========UPCOMING VISITS==========       ==========APPENDIX - ON TREATMENT VISIT NOTES==========   See weekly On Treatment Notes is Epic for details.

## 2023-01-11 NOTE — Progress Notes (Signed)
[facility] Summary Report / Loop Recorder 

## 2023-01-14 ENCOUNTER — Telehealth: Payer: Self-pay

## 2023-01-14 NOTE — Telephone Encounter (Signed)
Notified patient of completion of Cancer Claim Form. Copy of form placed for pick-up as requested by Patient. Release of Information placed with form for Patient's signature. Patient states that she will fax the form and that she has her pathology report. No other needs or concerns voiced at this time.

## 2023-01-28 ENCOUNTER — Telehealth: Payer: Self-pay | Admitting: Dietician

## 2023-01-28 NOTE — Telephone Encounter (Signed)
Patient cancelled follow up appointment due to work schedule.  Sent text offering evening appointment if she was still having concerns with eating.  Gennaro Africa, RDN, LDN Registered Dietitian, [facility] Part Time Remote (Usual office hours: Tuesday-Thursday) Mobile: [phone] Remote Office: [phone]

## 2023-01-29 NOTE — Progress Notes (Signed)
Radiation Oncology         [phone] ________________________________  Name: Mercedes Beltran MRN: [id number]  Date: 02/01/2023  DOB: 01-05-1966  Follow-Up Visit Note  CC: Erskine Emery, NP  Josph Macho, MD    ICD-10-CM   1. Follicular lymphoma grade I, unspecified body region [facility])  C82.00       Diagnosis: The primary encounter diagnosis was Follicular lymphoma of small intestine (HCC). A diagnosis of Follicular lymphoma grade I, unspecified body region [facility]) was also pertinent to this visit.   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  The patient tolerated radiation treatment relatively well. During her final weekly treatment check on 12/29/22, the patient endorsed abdominal pain rated at a 5/10, moderate fatigue, and mild diarrhea and nausea. She characterized her abdominal pain as a cramping-like sensation after having bowel movements. She denied any other side effects or concerns.                   In the midst of radiation treatments, the patient followed up with Dr. Myna Hidalgo on 12/24/22. During which time, the patient was noted to report feeling very tired/weak. In light of this, in depth labs were ordered which were unremarkable. A dark colored nodule was appreciated on her back during this visit. Dr. Myna Hidalgo was a bit concerned based on it's appearance and had placed a referral to dermatology to further evaluate this.   She is also being seen by her gynecologist for recurrent UTI's. She recently underwent a cytoscopy on 12/25/22 which showed no abnormal findings within the urethra, bladder, or ureteral orifice.   No other significant  interval history since the patient completed radiation therapy.   On evaluation today the patient reports her abdominal pain has resolved.  She continues to have some issues with nausea and will use Zofran for this issue.  She denies any emesis since completion of treatment.  She reports ongoing fatigue but has started back to work and reports her usual long hours of approximately 80 hours/week due to staffing issues.  Allergies:  is allergic to demerol  [meperidine hcl], codeine, demerol [meperidine], and morphine and codeine.  Meds: Current Outpatient Medications  Medication Sig Dispense Refill   amoxicillin (AMOXIL) 500 MG tablet Take 500 mg by mouth 2 (two) times daily.     metroNIDAZOLE (FLAGYL) 250 MG tablet Take 250 mg by mouth 2 (two) times daily.     Biotin 1 MG CAPS Take 1 capsule by mouth daily.     cetirizine (ZYRTEC) 10 MG tablet Take 10 mg by mouth daily.     famotidine (PEPCID) 40 MG tablet Take 40 mg by mouth daily.     levalbuterol (XOPENEX HFA) 45 MCG/ACT inhaler Inhale 1-2 puffs into the lungs every 6 (six) hours as needed for wheezing or shortness of breath. Further refills to be authorized by PCP (Patient not taking: Reported on 12/24/2022) 1 each 0   nitroGLYCERIN (NITROSTAT) 0.4 MG SL tablet Place 0.4 mg under the tongue every 5 (five) minutes as needed for chest pain. (Patient not taking: Reported on 12/24/2022)     ondansetron (ZOFRAN) 4 MG  tablet Take 1 tablet (4 mg total) by mouth every 8 (eight) hours as needed for nausea or vomiting. 30 tablet 1   RABEprazole (ACIPHEX) 20 MG tablet TAKE 2 CAPSULES BY MOUTH ONCE A DAY BEFORE MEALS     tiZANidine (ZANAFLEX) 2 MG tablet Take 1-2 tablets (2-4 mg total) by mouth every 6 (six) hours as needed for muscle spasms. 60 tablet 1   trimethoprim (TRIMPEX) 100 MG tablet Take 100 mg by mouth daily.     No current facility-administered medications for this encounter.    Physical Findings: The patient is in no acute distress.  Patient is alert and oriented.  height is 5\' 5"  (1.651 m) and weight is 159 lb 9.6 oz (72.4 kg). Her temperature is 97.8 F (36.6 C). Her blood pressure is 125/72 and her pulse is 53 (abnormal). Her respiration is 20 and oxygen saturation is 98%. .   Lungs are clear to auscultation bilaterally. Heart has regular rate and rhythm. No palpable cervical, supraclavicular, or axillary adenopathy. Abdomen soft, non-tender, normal bowel sounds.   Lab Findings: Lab Results  Component Value Date   WBC 4.6 12/24/2022   HGB 15.1 (H) 12/24/2022   HCT 44.1 12/24/2022   MCV 89.6 12/24/2022   PLT 257 12/24/2022    Radiographic Findings: No results found.  Impression: The primary encounter diagnosis was Follicular lymphoma of small intestine (HCC). A diagnosis of Follicular lymphoma grade I, unspecified body region [facility]) was also pertinent to this visit.   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  She continues to have some issues with nausea but this issue has improved since completion of treatment.  She denies any further abdominal pain.  Plan: In light of her close follow-up with medical oncology have not scheduled her for formal follow-up appointment but would be glad to see her at any time.  She will likely have a PET/CT scan 3 months out from her treatment.  She may depending on results of this study have an additional endoscopy.    ____________________________________  Billie Lade, PhD, MD  This document serves as a record of services personally performed by Antony Blackbird, MD. It was created on his behalf by Neena Rhymes, a trained medical scribe. The creation of this record is based on the scribe's personal observations and the provider's statements to them. This document has been checked and approved by the attending provider.

## 2023-01-29 NOTE — Progress Notes (Signed)
  Radiation Oncology         [phone] ________________________________  Name: Mercedes Beltran MRN: [id number]  Date: 02/01/2023  DOB: Sep 05, 1965  End of Treatment Note  Diagnosis:  The primary encounter diagnosis was Follicular lymphoma of small intestine (HCC). A diagnosis of Follicular lymphoma grade I, unspecified body region [facility]) was also pertinent to this visit.   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 During her final weekly treatment check on 12/29/22, the patient endorsed abdominal pain rated at a 5/10, moderate fatigue, and mild diarrhea and nausea. She characterized her abdominal pain as a cramping-like sensation after having bowel movements. She denied any other side effects or concerns.   Plan: The patient has completed radiation treatment. The patient will return to radiation oncology clinic for routine followup in one month. I advised them to call or return sooner if they have any questions or concerns related to their recovery or treatment.  -----------------------------------  Billie Lade, PhD, MD  This document serves as a record of services personally performed by Antony Blackbird, MD. It was created on his behalf by Neena Rhymes, a trained medical scribe. The creation of this record is based on the scribe's personal observations and the provider's statements to them. This document has been checked and approved by the attending provider.

## 2023-02-01 ENCOUNTER — Ambulatory Visit
Admission: RE | Admit: 2023-02-01 | Discharge: 2023-02-01 | Disposition: A | Discharge status: Home / Self Care | Payer: BC Managed Care – PPO | Source: Ambulatory Visit | Attending: Radiation Oncology | Admitting: Radiation Oncology

## 2023-02-01 ENCOUNTER — Other Ambulatory Visit: Payer: Self-pay

## 2023-02-01 VITALS — BP 125/72 | HR 53 | Temp 97.8°F | Resp 20 | Ht 65.0 in | Wt 159.6 lb

## 2023-02-01 DIAGNOSIS — C8209 Follicular lymphoma grade I, extranodal and solid organ sites: Secondary | ICD-10-CM | POA: Diagnosis present

## 2023-02-01 DIAGNOSIS — Z923 Personal history of irradiation: Secondary | ICD-10-CM | POA: Insufficient documentation

## 2023-02-01 DIAGNOSIS — C8200 Follicular lymphoma grade I, unspecified site: Secondary | ICD-10-CM

## 2023-02-01 NOTE — Progress Notes (Signed)
Mercedes Beltran is here today for follow up post radiation to the abdomen.   They completed their radiation on: 01/01/23  Does the patient complain of any of the following:  Pain: Denies abdominal pain, but does report intense/intermittent right ear and jaw pain from recent teeth cleaning.  Abdominal bloating: Denies Diarrhea/Constipation: Was experiencing diarrhea towards the end of her treatment. Reports she is now dealing with constipation which she is managing with OTC metamucil  Nausea/Vomiting: Continues to deal with nausea and decreased appetite  Post radiation skin changes: Denies Appetite: Reports appetite is poor to fair Wt Readings from Last 3 Encounters:  02/01/23 159 lb 9.6 oz (72.4 kg)  12/24/22 158 lb 12 oz (72 kg)  11/27/22 161 lb 3.2 oz (73.1 kg)      Additional comments if applicable: Had teeth cleaned on 01/27/2023. Currently taking 2 antibiotics from dentist and scheduled to see ENT on 02/24/23 (per patient)

## 2023-02-02 ENCOUNTER — Other Ambulatory Visit: Payer: BC Managed Care – PPO

## 2023-02-02 ENCOUNTER — Ambulatory Visit: Payer: BC Managed Care – PPO | Admitting: Hematology & Oncology

## 2023-02-03 NOTE — Progress Notes (Signed)
[facility] Summary Report / Loop Recorder 

## 2023-02-08 ENCOUNTER — Ambulatory Visit: Payer: BC Managed Care – PPO | Attending: Internal Medicine

## 2023-02-08 DIAGNOSIS — R55 Syncope and collapse: Secondary | ICD-10-CM

## 2023-02-09 LAB — CUP PACEART REMOTE DEVICE CHECK
Date Time Interrogation Session: 20240609230849
Implantable Pulse Generator Implant Date: 20210823

## 2023-02-10 ENCOUNTER — Encounter: Payer: BC Managed Care – PPO | Admitting: Dietician

## 2023-02-11 ENCOUNTER — Encounter: Payer: Self-pay | Admitting: *Deleted

## 2023-02-11 ENCOUNTER — Inpatient Hospital Stay: Payer: BC Managed Care – PPO | Attending: Hematology & Oncology

## 2023-02-11 ENCOUNTER — Telehealth: Payer: Self-pay

## 2023-02-11 ENCOUNTER — Telehealth: Payer: Self-pay | Admitting: Dietician

## 2023-02-11 ENCOUNTER — Inpatient Hospital Stay (HOSPITAL_BASED_OUTPATIENT_CLINIC_OR_DEPARTMENT_OTHER): Payer: BC Managed Care – PPO | Admitting: Hematology & Oncology

## 2023-02-11 ENCOUNTER — Encounter: Payer: Self-pay | Admitting: Hematology & Oncology

## 2023-02-11 ENCOUNTER — Inpatient Hospital Stay: Payer: BC Managed Care – PPO | Admitting: Dietician

## 2023-02-11 VITALS — BP 132/70 | HR 50 | Temp 97.6°F | Resp 20 | Ht 65.0 in | Wt 157.8 lb

## 2023-02-11 DIAGNOSIS — C8203 Follicular lymphoma grade I, intra-abdominal lymph nodes: Secondary | ICD-10-CM

## 2023-02-11 DIAGNOSIS — R5383 Other fatigue: Secondary | ICD-10-CM | POA: Insufficient documentation

## 2023-02-11 DIAGNOSIS — C8200 Follicular lymphoma grade I, unspecified site: Secondary | ICD-10-CM

## 2023-02-11 DIAGNOSIS — K649 Unspecified hemorrhoids: Secondary | ICD-10-CM | POA: Diagnosis not present

## 2023-02-11 DIAGNOSIS — R0602 Shortness of breath: Secondary | ICD-10-CM | POA: Insufficient documentation

## 2023-02-11 DIAGNOSIS — R110 Nausea: Secondary | ICD-10-CM | POA: Insufficient documentation

## 2023-02-11 DIAGNOSIS — M2550 Pain in unspecified joint: Secondary | ICD-10-CM | POA: Insufficient documentation

## 2023-02-11 DIAGNOSIS — R079 Chest pain, unspecified: Secondary | ICD-10-CM | POA: Insufficient documentation

## 2023-02-11 DIAGNOSIS — Z79899 Other long term (current) drug therapy: Secondary | ICD-10-CM | POA: Insufficient documentation

## 2023-02-11 DIAGNOSIS — Z885 Allergy status to narcotic agent status: Secondary | ICD-10-CM | POA: Diagnosis not present

## 2023-02-11 DIAGNOSIS — K909 Intestinal malabsorption, unspecified: Secondary | ICD-10-CM

## 2023-02-11 LAB — CMP (CANCER CENTER ONLY)
ALT: 41 U/L (ref 0–44)
AST: 33 U/L (ref 15–41)
Albumin: 4.5 g/dL (ref 3.5–5.0)
Alkaline Phosphatase: 107 U/L (ref 38–126)
Anion gap: 7 (ref 5–15)
BUN: 10 mg/dL (ref 6–20)
CO2: 31 mmol/L (ref 22–32)
Calcium: 10.1 mg/dL (ref 8.9–10.3)
Chloride: 106 mmol/L (ref 98–111)
Creatinine: 0.77 mg/dL (ref 0.44–1.00)
GFR, Estimated: >60 mL/min (ref >60)
Glucose, Bld: 106 mg/dL — ABNORMAL HIGH (ref 70–99)
Potassium: 4.1 mmol/L (ref 3.5–5.1)
Sodium: 144 mmol/L (ref 135–145)
Total Bilirubin: 0.7 mg/dL (ref 0.3–1.2)
Total Protein: 7.3 g/dL (ref 6.5–8.1)

## 2023-02-11 LAB — IRON AND IRON BINDING CAPACITY (CC-WL,HP ONLY)
Iron: 121 ug/dL (ref 28–170)
Saturation Ratios: 33 % — ABNORMAL HIGH (ref 10.4–31.8)
TIBC: 363 ug/dL (ref 250–450)
UIBC: 242 ug/dL (ref 148–442)

## 2023-02-11 LAB — RETICULOCYTES
Immature Retic Fract: 12.4 % (ref 2.3–15.9)
RBC.: 4.61 MIL/uL (ref 3.87–5.11)
Retic Count, Absolute: 75.1 10*3/uL (ref 19.0–186.0)
Retic Ct Pct: 1.6 % (ref 0.4–3.1)

## 2023-02-11 LAB — CBC WITH DIFFERENTIAL (CANCER CENTER ONLY)
Abs Immature Granulocytes: 0.04 10*3/uL (ref 0.00–0.07)
Basophils Absolute: 0 10*3/uL (ref 0.0–0.1)
Basophils Relative: 1 %
Eosinophils Absolute: 0.2 10*3/uL (ref 0.0–0.5)
Eosinophils Relative: 5 %
HCT: 42.1 % (ref 36.0–46.0)
Hemoglobin: 14.1 g/dL (ref 12.0–15.0)
Immature Granulocytes: 1 %
Lymphocytes Relative: 29 %
Lymphs Abs: 1.2 10*3/uL (ref 0.7–4.0)
MCH: 30.3 pg (ref 26.0–34.0)
MCHC: 33.5 g/dL (ref 30.0–36.0)
MCV: 90.5 fL (ref 80.0–100.0)
Monocytes Absolute: 0.4 10*3/uL (ref 0.1–1.0)
Monocytes Relative: 11 %
Neutro Abs: 2.2 10*3/uL (ref 1.7–7.7)
Neutrophils Relative %: 53 %
Platelet Count: 263 10*3/uL (ref 150–400)
RBC: 4.65 MIL/uL (ref 3.87–5.11)
RDW: 12.9 % (ref 11.5–15.5)
WBC Count: 4.2 10*3/uL (ref 4.0–10.5)
nRBC: 0 % (ref 0.0–0.2)

## 2023-02-11 LAB — FERRITIN: Ferritin: 43 ng/mL (ref 11–307)

## 2023-02-11 NOTE — Telephone Encounter (Signed)
-----   Message from Josph Macho, MD sent at 02/11/2023  3:36 PM EDT ----- Please call and let her know that the iron level is okay.  Thanks.  [name]

## 2023-02-11 NOTE — Progress Notes (Signed)
Hematology and Oncology Follow Up Visit  Maitreyi Talluto [id number] [birth year] 57 y.o. 02/11/2023   Principle Diagnosis:  Follicular lymphoma of the small intestine-stage I  Current Therapy:   XRT to abdominal node - started on 12/15/2022 -completed on 01/01/2023 -- 27 Gy     Interim History:  Ms. Suther is back for follow-up.  She is doing okay.  She got through her radiation fairly well.  She had little bit of nausea when she for started but the main time medics seem to help quite a bit.  She has had no problems with cough or shortness of breath.  She had she did have a CT screening scan for smoking.  This did not show any suspicious nodules.  She does have some hemorrhoid issues.  She will try some over-the-counter preparation.  If this does not work, she will see her family doctor.  She has had a little bit of abdominal discomfort.  She seems to be eating okay.  She has had no bleeding.  She has had no fever.  She has had no rashes.  She is back to work.  It sounds like she can work most of the summer since she had to take some time off for her radiation.  I did send off a JAK2 assay.  This was negative.  Currently, I would say that her performance status is ECOG 1.  Medications:  Current Outpatient Medications:    cetirizine (ZYRTEC) 10 MG tablet, Take 10 mg by mouth daily., Disp: , Rfl:    famotidine (PEPCID) 40 MG tablet, Take 40 mg by mouth daily., Disp: , Rfl:    levalbuterol (XOPENEX HFA) 45 MCG/ACT inhaler, Inhale 1-2 puffs into the lungs every 6 (six) hours as needed for wheezing or shortness of breath. Further refills to be authorized by PCP (Patient not taking: Reported on 12/24/2022), Disp: 1 each, Rfl: 0   nitroGLYCERIN (NITROSTAT) 0.4 MG SL tablet, Place 0.4 mg under the tongue every 5 (five) minutes as needed for chest pain. (Patient not taking: Reported on 12/24/2022), Disp: , Rfl:    ondansetron (ZOFRAN) 4 MG tablet, Take 1 tablet (4 mg total) by mouth every 8 (eight)  hours as needed for nausea or vomiting., Disp: 30 tablet, Rfl: 1   RABEprazole (ACIPHEX) 20 MG tablet, TAKE 2 CAPSULES BY MOUTH ONCE A DAY BEFORE MEALS, Disp: , Rfl:    tiZANidine (ZANAFLEX) 2 MG tablet, Take 1-2 tablets (2-4 mg total) by mouth every 6 (six) hours as needed for muscle spasms., Disp: 60 tablet, Rfl: 1  Allergies:  Allergies  Allergen Reactions   Demerol  [Meperidine Hcl] Nausea And Vomiting   Codeine Nausea And Vomiting   Demerol [Meperidine] Nausea And Vomiting   Morphine And Codeine Nausea And Vomiting    Past Medical History, Surgical history, Social history, and Family History were reviewed and updated.  Review of Systems: Review of Systems  Constitutional:  Positive for fatigue.  HENT:  Negative.    Eyes: Negative.   Respiratory:  Positive for shortness of breath.   Cardiovascular:  Positive for chest pain.  Gastrointestinal: Negative.   Endocrine: Negative.   Genitourinary: Negative.    Musculoskeletal:  Positive for arthralgias.  Skin: Negative.   Neurological: Negative.   Hematological: Negative.   Psychiatric/Behavioral: Negative.      Physical Exam: Vital signs are temperature of 97.6.  Pulse 50.  Blood pressure 132/70.  Weight is 157 pounds. Wt Readings from Last 3 Encounters:  02/01/23 159 lb 9.6  oz (72.4 kg)  12/24/22 158 lb 12 oz (72 kg)  11/27/22 161 lb 3.2 oz (73.1 kg)    Physical Exam Vitals reviewed.  HENT:     Head: Normocephalic and atraumatic.  Eyes:     Pupils: Pupils are equal, round, and reactive to light.  Cardiovascular:     Rate and Rhythm: Normal rate and regular rhythm.     Heart sounds: Normal heart sounds.  Pulmonary:     Effort: Pulmonary effort is normal.     Breath sounds: Normal breath sounds.  Abdominal:     General: Bowel sounds are normal.     Palpations: Abdomen is soft.  Genitourinary:    Comments: Visual exam of the anus did show some small hemorrhoids.  There did not appear to be  thrombosed. Musculoskeletal:        General: No tenderness or deformity. Normal range of motion.     Cervical back: Normal range of motion.  Lymphadenopathy:     Cervical: No cervical adenopathy.  Skin:    General: Skin is warm and dry.     Findings: No erythema or rash.     Comments: On the middle upper portion of her back, there is a dark nodule.  This probably measures about 3 x 2 mm.  It is nontender.  Neurological:     Mental Status: She is alert and oriented to person, place, and time.  Psychiatric:        Behavior: Behavior normal.        Thought Content: Thought content normal.        Judgment: Judgment normal.      Lab Results  Component Value Date   WBC 4.2 02/11/2023   HGB 14.1 02/11/2023   HCT 42.1 02/11/2023   MCV 90.5 02/11/2023   PLT 263 02/11/2023     Chemistry      Component Value Date/Time   NA 143 12/24/2022 0803   NA 140 10/12/2022 1647   NA 143 08/23/2015 0824   K 4.0 12/24/2022 0803   K 4.3 08/23/2015 0824   CL 104 12/24/2022 0803   CL 105 08/23/2015 0824   CO2 30 12/24/2022 0803   CO2 25 08/23/2015 0824   BUN 11 12/24/2022 0803   BUN 10 10/12/2022 1647   BUN 11 08/23/2015 0824   CREATININE 0.82 12/24/2022 0803   CREATININE 0.8 08/23/2015 0824      Component Value Date/Time   CALCIUM 9.5 12/24/2022 0803   CALCIUM 9.4 08/23/2015 0824   ALKPHOS 95 12/24/2022 0803   ALKPHOS 61 08/23/2015 0824   AST 30 12/24/2022 0803   ALT 37 12/24/2022 0803   ALT 30 08/23/2015 0824   BILITOT 0.7 12/24/2022 0803       Impression and Plan: Ms. Spainhower is a very nice 57 year old white female.  She was found to have a localized lymphoma on upper endoscopy.    We will go ahead and follow-up with another PET scan on her.  Will get this set up for early August.  I would suspect that the PET scan should be negative.  Hopefully, the hemorrhoid issue is not can be a problem for her.  We will plan to get her back in a couple months.  She currently is getting  radiotherapy for this since this is so localized.  I will a bit worried about this nodule on her back.  I think this plan is to be looked at.  We will see about making a  referral to Dermatology.  She lives in [place] so maybe we get one of the dermatologist in [place] to see her.  We will see what her labs show with respect to her fatigue and weakness.  I did state that she feels tired.  It will be interesting to see what her JAK2 study is.  I would like to see her back in 6 weeks.      Josph Macho, MD 6/13/20248:02 AM

## 2023-02-11 NOTE — Telephone Encounter (Signed)
Attempted to reach patient for a scheduled remote nutrition consult. Provided my cell# on voice mail and ina text to return call for her follow up nutrition consult.  Gennaro Africa, RDN, LDN Registered Dietitian, [facility] Part Time Remote (Usual office hours: Tuesday-Thursday) Cell: [phone]

## 2023-02-11 NOTE — Progress Notes (Signed)
Patient was seen by Dr Myna Hidalgo this morning. She is doing well post radiation and has returned to work. She needs a PET, however patient likes to schedule these around her work schedule. MyChart message sent to patient with instructions on how to schedule.   Will follow to make sure PET is scheduled.   Oncology Nurse Navigator Documentation     02/11/2023    9:45 AM  Oncology Nurse Navigator Flowsheets  Phase of Treatment Radiation  Radiation Actual End Date: 01/01/2023  Navigator Follow Up Date: 02/12/2023  Navigator Follow Up Reason: Radiology  Navigator Location [facility]  Navigator Encounter Type Appt/Treatment Plan Review;MyChart  Patient Visit Type MedOnc  Treatment Phase Post-Tx Follow-up  Barriers/Navigation Needs Coordination of Care;Education  Education Other  Interventions Education  Acuity Level 2-Minimal Needs (1-2 Barriers Identified)  Education Method Written  Support Groups/Services Friends and Family  Time Spent with Patient 15

## 2023-02-11 NOTE — Progress Notes (Signed)
Nutrition Follow Up:    Patient returned call during her commute.  She reports having hands free access and feeling safe to talk for short while. Not much of an appetite, but feels fine at current weight and has been able to maintain 2# range. Patient reports still having trouble with foods are tasting like metal( all foods) and she continues using plastic ware. Some concern with hemorrhoids. Still using Premier for breakfast (30 grams protein) or [place] yogurt with similar choices for lunch and dinner.    Nutrition Focused Physical Exam: unable to perform NFPE   Medications: Still using Zofran for nausea 3-4 times a week, not getting much relief, taking stool softener daily   Labs: reviewed 02/11/23 glucose 106  Anthropometrics: stable within 2#  Height: 65" Weight:  02/11/23  157.8# 02/01/23  159.6# 12/24/22  158.8# 11/27/22 161# 10/29/22 160# 10/16/22 168# UBW: 160-165# BMI: 26.26   Estimated Energy Needs  Kcals: [address] Protein: 88-110 g Fluid: 2.5 L   NUTRITION DIAGNOSIS: Inadequate intake r/t NIS anorexia, dysgeusia, constipation, and early satiety to meet needs.  Improved with use of antiemetics, weight stable.  INTERVENTION:  Reviewed fiber choices best for hemorrhoids. Suggested ginger products for nausea.  Offered resources or recipes, patient decline need or questions at this time. Emailed information on Tummy pops with coupon code with contact information provided.  MONITORING, EVALUATION, GOAL: weight trends, nutrition impact symptoms, PO intake, labs  Goal is weight maintenance.  Next Visit: PRN at patient or provider request.  Gennaro Africa, RDN, LDN Registered Dietitian, [facility] Part Time Remote (Usual office hours: Tuesday-Thursday) Mobile: [phone]

## 2023-02-12 ENCOUNTER — Encounter: Payer: Self-pay | Admitting: *Deleted

## 2023-02-12 ENCOUNTER — Encounter: Payer: Self-pay | Admitting: Hematology & Oncology

## 2023-02-12 NOTE — Progress Notes (Signed)
Patient requested I schedule PET on August 7th or 8th. Scheduled for 04/08/2023.  Patient is aware of PET appointment including date, time, and location. The following prep is reviewed with patient   - arrive 30 minutes before appointment time - NPO except water for 6h before scan. No candy, no gum - hold any diabetic medication the morning of the scan - have a low carb dinner the night prior  Radiology Information sheet also mailed to patient's home for reinforcement of education.  Oncology Nurse Navigator Documentation     02/12/2023   10:15 AM  Oncology Nurse Navigator Flowsheets  Navigator Follow Up Date: 04/08/2023  Navigator Follow Up Reason: Scan Review  Navigator Location [facility]  Navigator Encounter Type MyChart;Appt/Treatment Plan Review  Patient Visit Type MedOnc  Treatment Phase Post-Tx Follow-up  Barriers/Navigation Needs Coordination of Care;Education  Education Other  Interventions Coordination of Care;Education  Acuity Level 2-Minimal Needs (1-2 Barriers Identified)  Coordination of Care Radiology  Education Method Written  Support Groups/Services Friends and Family  Time Spent with Patient 15

## 2023-02-16 ENCOUNTER — Ambulatory Visit: Payer: BC Managed Care – PPO | Attending: Internal Medicine | Admitting: Internal Medicine

## 2023-02-16 VITALS — BP 100/62 | HR 87 | Ht 65.0 in | Wt 156.2 lb

## 2023-02-16 DIAGNOSIS — R55 Syncope and collapse: Secondary | ICD-10-CM | POA: Diagnosis not present

## 2023-02-16 NOTE — Patient Instructions (Signed)
Medication Instructions:  Your physician recommends that you continue on your current medications as directed. Please refer to the Current Medication list given to you today.  *If you need a refill on your cardiac medications before your next appointment, please call your pharmacy*  Lab Work: If you have labs (blood work) drawn today and your tests are completely normal, you will receive your results only by: MyChart Message (if you have MyChart) OR A paper copy in the mail If you have any lab test that is abnormal or we need to change your treatment, we will call you to review the results.  Follow-Up: At [facility], you and your health needs are our priority.  As part of our continuing mission to provide you with exceptional heart care, we have created designated Provider Care Teams.  These Care Teams include your primary Cardiologist (physician) and Advanced Practice Providers (APPs -  Physician Assistants and Nurse Practitioners) who all work together to provide you with the care you need, when you need it.  We recommend signing up for the patient portal called "MyChart".  Sign up information is provided on this After Visit Summary.  MyChart is used to connect with patients for Virtual Visits (Telemedicine).  Patients are able to view lab/test results, encounter notes, upcoming appointments, etc.  Non-urgent messages can be sent to your provider as well.   To learn more about what you can do with MyChart, go to [email or web address].    Your next appointment:   As needed   Provider:   You may see Dr. Taylor or one of the following Advanced Practice Providers on your designated Care Team:   Renee Ursuy, PA-C Michael "Andy" Tillery, PA-C Suzann Riddle, NP    

## 2023-02-16 NOTE — Progress Notes (Signed)
HPI Mercedes Beltran returns today for ongoing evaluation of unexplained syncope. She is a very pleasant middle-aged woman with a h/o syncope dating back several years. She wore a cardiac monitor which demonstrated sinus rhythm and nonsustained atrial tachycardia and sinus bradycardia all of which were asymptomatic. She has preserved left ventricular systolic function. She had an ILR placed and she had sinus node dysfunction of a beta blocker and flecainide. She stopped these. She has mostly well controlled PVC's/PAC's and no recurrent syncope. She has been diagnosed with lymphoma and has been treated with chemotherapy. Allergies  Allergen Reactions   Demerol  [Meperidine Hcl] Nausea And Vomiting   Codeine Nausea And Vomiting   Demerol [Meperidine] Nausea And Vomiting   Morphine And Codeine Nausea And Vomiting     Current Outpatient Medications  Medication Sig Dispense Refill   cetirizine (ZYRTEC) 10 MG tablet Take 10 mg by mouth daily.     famotidine (PEPCID) 40 MG tablet Take 40 mg by mouth daily.     levalbuterol (XOPENEX HFA) 45 MCG/ACT inhaler Inhale 1-2 puffs into the lungs every 6 (six) hours as needed for wheezing or shortness of breath. Further refills to be authorized by PCP 1 each 0   nitroGLYCERIN (NITROSTAT) 0.4 MG SL tablet Place 0.4 mg under the tongue every 5 (five) minutes as needed for chest pain. (Patient not taking: Reported on 12/24/2022)     ondansetron (ZOFRAN) 4 MG tablet Take 1 tablet (4 mg total) by mouth every 8 (eight) hours as needed for nausea or vomiting. 30 tablet 1   RABEprazole (ACIPHEX) 20 MG tablet TAKE 2 CAPSULES BY MOUTH ONCE A DAY BEFORE MEALS     tiZANidine (ZANAFLEX) 2 MG tablet Take 1-2 tablets (2-4 mg total) by mouth every 6 (six) hours as needed for muscle spasms. 60 tablet 1   No current facility-administered medications for this visit.     Past Medical History:  Diagnosis Date   Abnormal MRI, pelvis    Bradycardia    COPD (chronic  obstructive pulmonary disease) (HCC)    Diverticulosis of colon    DOE (dyspnea on exertion) 05/14/2017   Fatty liver    GERD (gastroesophageal reflux disease)    Headache    History of hiatal hernia    History of radiation therapy    Abdomen 12/14/2022 - 01/01/2023 - Dr. 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   Past Surgical History:  Procedure Laterality Date   ABDOMINAL HYSTERECTOMY     CHOLECYSTECTOMY     IR BONE MARROW BIOPSY & ASPIRATION  10/29/2022     Family History  Problem Relation Age of Onset   Breast cancer Paternal Aunt        x6, not sure of ages of onset   Cancer Mother        colon, liver,lung   Heart disease Maternal Grandmother    Cancer Maternal Grandfather      Social History   Socioeconomic History   Marital status: Married    Spouse name: Not on file   Number of children: Not on file   Years of education: Not on file   Highest education level: Not on file  Occupational History   Not on file  Tobacco Use  Smoking status: Former    Packs/day: 1.00    Years: 30.00    Additional pack years: 0.00    Total pack years: 30.00    Types: Cigarettes    Quit date: 02/20/2011    Years since quitting: 11.9   Smokeless tobacco: Never   Tobacco comments:    quit 01/2011  Vaping Use   Vaping Use: Never used  Substance and Sexual Activity   Alcohol use: No    Alcohol/week: 0.0 standard drinks of alcohol   Drug use: Not Currently   Sexual activity: Not Currently  Other Topics Concern   Not on file  Social History Narrative   Not on file   Social Determinants of Health   Financial Resource Strain: Low Risk  (12/24/2022)   Overall Financial Resource Strain (CARDIA)    Difficulty of Paying Living Expenses: Not hard at all  Food Insecurity: No Food Insecurity (11/25/2022)   Hunger  Vital Sign    Worried About Running Out of Food in the Last Year: Never true    Ran Out of Food in the Last Year: Never true  Transportation Needs: No Transportation Needs (11/25/2022)   PRAPARE - Administrator, Civil Service (Medical): No    Lack of Transportation (Non-Medical): No  Physical Activity: Not on file  Stress: Not on file  Social Connections: Not on file  Intimate Partner Violence: Not At Risk (11/25/2022)   Humiliation, Afraid, Rape, and Kick questionnaire    Fear of Current or Ex-Partner: No    Emotionally Abused: No    Physically Abused: No    Sexually Abused: No     BP 100/62 (BP Location: Right Arm, Patient Position: Sitting, Cuff Size: Normal)   Pulse 87   Ht 5\' 5"  (1.651 m)   Wt 156 lb 3.2 oz (70.9 kg)   SpO2 96%   BMI 25.99 kg/m   Physical Exam:  Well appearing NAD HEENT: Unremarkable Neck:  No JVD, no thyromegally Lymphatics:  No adenopathy Back:  No CVA tenderness Lungs:  Clear with no wheezes HEART:  Regular rate rhythm, no murmurs, no rubs, no clicks Abd:  soft, positive bowel sounds, no organomegally, no rebound, no guarding Ext:  2 plus pulses, no edema, no cyanosis, no clubbing Skin:  No rashes no nodules Neuro:  CN II through XII intact, motor grossly intact  DEVICE  Normal device function.  See PaceArt for details.   Assess/Plan: Syncope - no recurrent symptoms. PVC's - her symptoms are well controlled.  ILR - she has not had any pauses or sustained VT  Mercedes Gowda Kharma Sampsel,MD

## 2023-03-03 NOTE — Progress Notes (Signed)
[facility] Summary Report / Loop Recorder 

## 2023-03-15 ENCOUNTER — Ambulatory Visit (INDEPENDENT_AMBULATORY_CARE_PROVIDER_SITE_OTHER): Payer: BC Managed Care – PPO

## 2023-03-15 DIAGNOSIS — R55 Syncope and collapse: Secondary | ICD-10-CM | POA: Diagnosis not present

## 2023-03-17 LAB — CUP PACEART REMOTE DEVICE CHECK
Date Time Interrogation Session: 20240712230641
Implantable Pulse Generator Implant Date: 20210823

## 2023-03-31 NOTE — Progress Notes (Signed)
[facility] Summary Report / Loop Recorder 

## 2023-04-08 ENCOUNTER — Encounter (HOSPITAL_COMMUNITY)
Admission: RE | Admit: 2023-04-08 | Discharge: 2023-04-08 | Disposition: A | Discharge status: Home / Self Care | Payer: BC Managed Care – PPO | Source: Ambulatory Visit | Attending: Hematology & Oncology | Admitting: Hematology & Oncology

## 2023-04-08 DIAGNOSIS — C8203 Follicular lymphoma grade I, intra-abdominal lymph nodes: Secondary | ICD-10-CM | POA: Diagnosis not present

## 2023-04-08 LAB — GLUCOSE, CAPILLARY: Glucose-Capillary: 95 mg/dL (ref 70–99)

## 2023-04-08 MED ORDER — FLUDEOXYGLUCOSE F - 18 (FDG) INJECTION
7.8000 | Freq: Once | INTRAVENOUS | Status: AC
  Administered 2023-04-08: 8 via INTRAVENOUS

## 2023-04-12 ENCOUNTER — Telehealth: Payer: Self-pay

## 2023-04-12 ENCOUNTER — Encounter: Payer: Self-pay | Admitting: *Deleted

## 2023-04-12 NOTE — Telephone Encounter (Signed)
Advised via MyChart.

## 2023-04-12 NOTE — Telephone Encounter (Signed)
-----   Message from Mercedes Beltran sent at 04/10/2023  5:31 AM EDT ----- Call - the PET scan is normal now.  The XRT has helped!!!  No active lymphoma!!  [name]

## 2023-04-12 NOTE — Progress Notes (Signed)
Patient's PET shows CR. She has completed therapy and is now on observation. Will discontinue active navigation at this time but be available to the patient as needed in the future.   Oncology Nurse Navigator Documentation     04/12/2023    8:00 AM  Oncology Nurse Navigator Flowsheets  Navigation Complete Date: 04/12/2023  Post Navigation: Continue to Follow Patient? No  Reason Not Navigating Patient: No Treatment, Observation Only  Navigator Location [facility]  Navigator Encounter Type Scan Review  Patient Visit Type MedOnc  Treatment Phase Post-Tx Follow-up  Time Spent with Patient 15

## 2023-04-13 ENCOUNTER — Inpatient Hospital Stay: Payer: BC Managed Care – PPO | Admitting: Hematology & Oncology

## 2023-04-13 ENCOUNTER — Inpatient Hospital Stay: Payer: BC Managed Care – PPO

## 2023-04-16 ENCOUNTER — Encounter: Payer: Self-pay | Admitting: Hematology & Oncology

## 2023-04-16 LAB — CUP PACEART REMOTE DEVICE CHECK
Date Time Interrogation Session: 20240816092035
Implantable Pulse Generator Implant Date: 20210823

## 2023-04-19 ENCOUNTER — Ambulatory Visit: Payer: BC Managed Care – PPO

## 2023-04-19 DIAGNOSIS — R55 Syncope and collapse: Secondary | ICD-10-CM | POA: Diagnosis not present

## 2023-04-21 ENCOUNTER — Other Ambulatory Visit: Payer: Self-pay

## 2023-04-21 DIAGNOSIS — K909 Intestinal malabsorption, unspecified: Secondary | ICD-10-CM

## 2023-04-22 ENCOUNTER — Inpatient Hospital Stay: Payer: BC Managed Care – PPO

## 2023-04-22 ENCOUNTER — Inpatient Hospital Stay: Payer: BC Managed Care – PPO | Attending: Hematology & Oncology | Admitting: Hematology & Oncology

## 2023-04-22 ENCOUNTER — Encounter: Payer: Self-pay | Admitting: *Deleted

## 2023-04-22 ENCOUNTER — Encounter: Payer: Self-pay | Admitting: Hematology & Oncology

## 2023-04-22 VITALS — BP 118/69 | HR 59 | Temp 98.0°F | Resp 20 | Ht 65.0 in | Wt 161.0 lb

## 2023-04-22 DIAGNOSIS — Z885 Allergy status to narcotic agent status: Secondary | ICD-10-CM | POA: Diagnosis not present

## 2023-04-22 DIAGNOSIS — R110 Nausea: Secondary | ICD-10-CM | POA: Insufficient documentation

## 2023-04-22 DIAGNOSIS — K909 Intestinal malabsorption, unspecified: Secondary | ICD-10-CM

## 2023-04-22 DIAGNOSIS — R079 Chest pain, unspecified: Secondary | ICD-10-CM | POA: Insufficient documentation

## 2023-04-22 DIAGNOSIS — C8203 Follicular lymphoma grade I, intra-abdominal lymph nodes: Secondary | ICD-10-CM | POA: Diagnosis present

## 2023-04-22 DIAGNOSIS — M2550 Pain in unspecified joint: Secondary | ICD-10-CM | POA: Insufficient documentation

## 2023-04-22 DIAGNOSIS — R0602 Shortness of breath: Secondary | ICD-10-CM | POA: Insufficient documentation

## 2023-04-22 DIAGNOSIS — R5383 Other fatigue: Secondary | ICD-10-CM | POA: Insufficient documentation

## 2023-04-22 DIAGNOSIS — Z79899 Other long term (current) drug therapy: Secondary | ICD-10-CM | POA: Insufficient documentation

## 2023-04-22 DIAGNOSIS — K5900 Constipation, unspecified: Secondary | ICD-10-CM | POA: Insufficient documentation

## 2023-04-22 LAB — CBC WITH DIFFERENTIAL (CANCER CENTER ONLY)
Abs Immature Granulocytes: 0.01 10*3/uL (ref 0.00–0.07)
Basophils Absolute: 0.1 10*3/uL (ref 0.0–0.1)
Basophils Relative: 1 %
Eosinophils Absolute: 0.2 10*3/uL (ref 0.0–0.5)
Eosinophils Relative: 4 %
HCT: 41.6 % (ref 36.0–46.0)
Hemoglobin: 14 g/dL (ref 12.0–15.0)
Immature Granulocytes: 0 %
Lymphocytes Relative: 23 %
Lymphs Abs: 1.5 10*3/uL (ref 0.7–4.0)
MCH: 30.8 pg (ref 26.0–34.0)
MCHC: 33.7 g/dL (ref 30.0–36.0)
MCV: 91.4 fL (ref 80.0–100.0)
Monocytes Absolute: 0.7 10*3/uL (ref 0.1–1.0)
Monocytes Relative: 11 %
Neutro Abs: 4.1 10*3/uL (ref 1.7–7.7)
Neutrophils Relative %: 61 %
Platelet Count: 257 10*3/uL (ref 150–400)
RBC: 4.55 MIL/uL (ref 3.87–5.11)
RDW: 12.4 % (ref 11.5–15.5)
WBC Count: 6.6 10*3/uL (ref 4.0–10.5)
nRBC: 0 % (ref 0.0–0.2)

## 2023-04-22 LAB — CMP (CANCER CENTER ONLY)
ALT: 48 U/L — ABNORMAL HIGH (ref 0–44)
AST: 37 U/L (ref 15–41)
Albumin: 4.2 g/dL (ref 3.5–5.0)
Alkaline Phosphatase: 118 U/L (ref 38–126)
Anion gap: 8 (ref 5–15)
BUN: 12 mg/dL (ref 6–20)
CO2: 31 mmol/L (ref 22–32)
Calcium: 9.8 mg/dL (ref 8.9–10.3)
Chloride: 105 mmol/L (ref 98–111)
Creatinine: 0.9 mg/dL (ref 0.44–1.00)
GFR, Estimated: >60 mL/min (ref >60)
Glucose, Bld: 97 mg/dL (ref 70–99)
Potassium: 4 mmol/L (ref 3.5–5.1)
Sodium: 144 mmol/L (ref 135–145)
Total Bilirubin: 0.7 mg/dL (ref 0.3–1.2)
Total Protein: 7.3 g/dL (ref 6.5–8.1)

## 2023-04-22 LAB — RETICULOCYTES
Immature Retic Fract: 14.1 % (ref 2.3–15.9)
RBC.: 4.7 MIL/uL (ref 3.87–5.11)
Retic Count, Absolute: 74.7 10*3/uL (ref 19.0–186.0)
Retic Ct Pct: 1.6 % (ref 0.4–3.1)

## 2023-04-22 LAB — IRON AND IRON BINDING CAPACITY (CC-WL,HP ONLY)
Iron: 124 ug/dL (ref 28–170)
Saturation Ratios: 35 % — ABNORMAL HIGH (ref 10.4–31.8)
TIBC: 358 ug/dL (ref 250–450)
UIBC: 234 ug/dL (ref 148–442)

## 2023-04-22 LAB — FERRITIN: Ferritin: 31 ng/mL (ref 11–307)

## 2023-04-22 NOTE — Progress Notes (Signed)
Hematology and Oncology Follow Up Visit  Mercedes Beltran [id number] Apr 06, 1966 57 y.o. 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 Mercedes Beltran is back for follow-up.  She is managing pretty well.  She did have a PET scan that was done.  This was done on 04/08/2023.  The PET scan did not show any activity.  I suspect that she probably is going need to have an upper endoscopy again to look down into the area of the duodenum to see if there is any abnormalities and maybe to do some biopsies.  She has had some abdominal discomfort.  She has had maybe little nausea but no vomiting.  She has had no change in bowels or bladder.  She is little constipated.  She can certainly try some MiraLAX.  She has had no fever.  She is still working.  She has had no bleeding.  There has been no rashes.  She has had no cough or shortness of breath.  Overall, I would say that her performance status is probably ECOG 1.    Medications:  Current Outpatient Medications:    AIRSUPRA 90-80 MCG/ACT AERO, Inhale 2 puffs into the lungs every 4 (four) hours as needed., Disp: , Rfl:    cetirizine (ZYRTEC) 10 MG tablet, Take 10 mg by mouth daily., Disp: , Rfl:    famotidine (PEPCID) 40 MG tablet, Take 40 mg by mouth daily., Disp: , Rfl:    fluticasone (FLONASE) 50 MCG/ACT nasal spray, Place 2 sprays into both nostrils daily., Disp: , Rfl:    RABEprazole (ACIPHEX) 20 MG tablet, TAKE 2 CAPSULES BY MOUTH ONCE A DAY BEFORE MEALS, Disp: , Rfl:    tiZANidine (ZANAFLEX) 2 MG tablet, Take 1-2 tablets (2-4 mg total) by mouth every 6 (six) hours as needed for muscle spasms., Disp: 60 tablet, Rfl: 1   VAGIFEM 10 MCG TABS vaginal tablet, SMARTSIG:1 Tablet(s) Vaginal Every Other Day, Disp: , Rfl:    nitroGLYCERIN (NITROSTAT) 0.4 MG SL tablet, Place 0.4 mg under the tongue  every 5 (five) minutes as needed for chest pain. (Patient not taking: Reported on 12/24/2022), Disp: , Rfl:    ondansetron (ZOFRAN) 4 MG tablet, Take 1 tablet (4 mg total) by mouth every 8 (eight) hours as needed for nausea or vomiting. (Patient not taking: Reported on 04/22/2023), Disp: 30 tablet, Rfl: 1  Allergies:  Allergies  Allergen Reactions   Demerol  [Meperidine Hcl] Nausea And Vomiting   Codeine Nausea And Vomiting   Demerol [Meperidine] Nausea And Vomiting   Morphine And Codeine Nausea And Vomiting    Past Medical History, Surgical history, Social history, and Family History were reviewed and updated.  Review of Systems: Review of Systems  Constitutional:  Positive for fatigue.  HENT:  Negative.    Eyes: Negative.   Respiratory:  Positive for shortness of breath.   Cardiovascular:  Positive for chest pain.  Gastrointestinal: Negative.   Endocrine: Negative.   Genitourinary: Negative.    Musculoskeletal:  Positive for arthralgias.  Skin: Negative.   Neurological: Negative.   Hematological: Negative.   Psychiatric/Behavioral: Negative.      Physical Exam: Vital signs are temperature of 97.6.  Pulse 50.  Blood pressure 118/70.  Weight is 161 pounds.  Wt Readings from Last 3 Encounters:  04/22/23 161 lb (73 kg)  04/08/23  160 lb (72.6 kg)  02/16/23 156 lb 3.2 oz (70.9 kg)    Physical Exam Vitals reviewed.  HENT:     Head: Normocephalic and atraumatic.  Eyes:     Pupils: Pupils are equal, round, and reactive to light.  Cardiovascular:     Rate and Rhythm: Normal rate and regular rhythm.     Heart sounds: Normal heart sounds.  Pulmonary:     Effort: Pulmonary effort is normal.     Breath sounds: Normal breath sounds.  Abdominal:     General: Bowel sounds are normal.     Palpations: Abdomen is soft.  Genitourinary:    Comments: Visual exam of the anus did show some small hemorrhoids.  There did not appear to be thrombosed. Musculoskeletal:        General: No  tenderness or deformity. Normal range of motion.     Cervical back: Normal range of motion.  Lymphadenopathy:     Cervical: No cervical adenopathy.  Skin:    General: Skin is warm and dry.     Findings: No erythema or rash.     Comments: On the middle upper portion of her back, there is a dark nodule.  This probably measures about 3 x 2 mm.  It is nontender.  Neurological:     Mental Status: She is alert and oriented to person, place, and time.  Psychiatric:        Behavior: Behavior normal.        Thought Content: Thought content normal.        Judgment: Judgment normal.      Lab Results  Component Value Date   WBC 4.2 02/11/2023   HGB 14.1 02/11/2023   HCT 42.1 02/11/2023   MCV 90.5 02/11/2023   PLT 263 02/11/2023     Chemistry      Component Value Date/Time   NA 144 02/11/2023 0738   NA 140 10/12/2022 1647   NA 143 08/23/2015 0824   K 4.1 02/11/2023 0738   K 4.3 08/23/2015 0824   CL 106 02/11/2023 0738   CL 105 08/23/2015 0824   CO2 31 02/11/2023 0738   CO2 25 08/23/2015 0824   BUN 10 02/11/2023 0738   BUN 10 10/12/2022 1647   BUN 11 08/23/2015 0824   CREATININE 0.77 02/11/2023 0738   CREATININE 0.8 08/23/2015 0824      Component Value Date/Time   CALCIUM 10.1 02/11/2023 0738   CALCIUM 9.4 08/23/2015 0824   ALKPHOS 107 02/11/2023 0738   ALKPHOS 61 08/23/2015 0824   AST 33 02/11/2023 0738   ALT 41 02/11/2023 0738   ALT 30 08/23/2015 0824   BILITOT 0.7 02/11/2023 0738       Impression and Plan: Ms. Mercedes Beltran is a very nice 57 year old white female.  She was found to have a localized lymphoma on upper endoscopy.    I am happy that the PET scan did not show anything that looked active.  Again, we will probably have to do a upper endoscopy on her to see if there is anything that might be residual in that area of the duodenum.  We will have to find out who her Gastroenterologist was.  She still has not had a dermatologic appointment for this hyperpigmented  lesion on her back.  This is the middle of her back.  I think this needs to be looked at.  We will hold off on any additional scans on her for right now.  I will plan to  get her back probably at the end of the year.    Mercedes Macho, MD 8/22/20248:08 AM

## 2023-04-22 NOTE — Progress Notes (Signed)
Error

## 2023-04-30 NOTE — Progress Notes (Signed)
[facility] Summary Report / Loop Recorder 

## 2023-05-24 ENCOUNTER — Ambulatory Visit (INDEPENDENT_AMBULATORY_CARE_PROVIDER_SITE_OTHER): Payer: BC Managed Care – PPO

## 2023-05-24 DIAGNOSIS — R55 Syncope and collapse: Secondary | ICD-10-CM | POA: Diagnosis not present

## 2023-06-08 NOTE — Progress Notes (Signed)
[facility] Summary Report / Loop Recorder 

## 2023-06-28 ENCOUNTER — Ambulatory Visit (INDEPENDENT_AMBULATORY_CARE_PROVIDER_SITE_OTHER): Payer: BC Managed Care – PPO

## 2023-06-28 DIAGNOSIS — R55 Syncope and collapse: Secondary | ICD-10-CM

## 2023-06-28 LAB — CUP PACEART REMOTE DEVICE CHECK
Date Time Interrogation Session: 20241027230140
Implantable Pulse Generator Implant Date: 20210823

## 2023-07-19 NOTE — Progress Notes (Signed)
[facility] Summary Report / Loop Recorder 

## 2023-08-01 LAB — CUP PACEART REMOTE DEVICE CHECK
Date Time Interrogation Session: 20241129230017
Implantable Pulse Generator Implant Date: 20210823

## 2023-08-02 ENCOUNTER — Ambulatory Visit (INDEPENDENT_AMBULATORY_CARE_PROVIDER_SITE_OTHER): Payer: BC Managed Care – PPO

## 2023-08-02 DIAGNOSIS — R55 Syncope and collapse: Secondary | ICD-10-CM

## 2023-08-12 ENCOUNTER — Inpatient Hospital Stay: Payer: BC Managed Care – PPO | Attending: Hematology & Oncology

## 2023-08-12 ENCOUNTER — Inpatient Hospital Stay: Payer: BC Managed Care – PPO | Admitting: Hematology & Oncology

## 2023-08-12 ENCOUNTER — Encounter: Payer: Self-pay | Admitting: Hematology & Oncology

## 2023-08-12 VITALS — BP 122/66 | HR 59 | Temp 97.6°F | Resp 20 | Ht 65.0 in | Wt 163.4 lb

## 2023-08-12 DIAGNOSIS — R0602 Shortness of breath: Secondary | ICD-10-CM | POA: Insufficient documentation

## 2023-08-12 DIAGNOSIS — K909 Intestinal malabsorption, unspecified: Secondary | ICD-10-CM | POA: Diagnosis not present

## 2023-08-12 DIAGNOSIS — Z885 Allergy status to narcotic agent status: Secondary | ICD-10-CM | POA: Diagnosis not present

## 2023-08-12 DIAGNOSIS — C8208 Follicular lymphoma grade I, lymph nodes of multiple sites: Secondary | ICD-10-CM | POA: Diagnosis not present

## 2023-08-12 DIAGNOSIS — C8203 Follicular lymphoma grade I, intra-abdominal lymph nodes: Secondary | ICD-10-CM | POA: Insufficient documentation

## 2023-08-12 DIAGNOSIS — R079 Chest pain, unspecified: Secondary | ICD-10-CM | POA: Insufficient documentation

## 2023-08-12 DIAGNOSIS — M2550 Pain in unspecified joint: Secondary | ICD-10-CM | POA: Insufficient documentation

## 2023-08-12 DIAGNOSIS — R5383 Other fatigue: Secondary | ICD-10-CM | POA: Diagnosis not present

## 2023-08-12 DIAGNOSIS — Z79899 Other long term (current) drug therapy: Secondary | ICD-10-CM | POA: Insufficient documentation

## 2023-08-12 LAB — CBC WITH DIFFERENTIAL (CANCER CENTER ONLY)
Abs Immature Granulocytes: 0.03 10*3/uL (ref 0.00–0.07)
Basophils Absolute: 0.1 10*3/uL (ref 0.0–0.1)
Basophils Relative: 1 %
Eosinophils Absolute: 0.1 10*3/uL (ref 0.0–0.5)
Eosinophils Relative: 2 %
HCT: 41.5 % (ref 36.0–46.0)
Hemoglobin: 13.7 g/dL (ref 12.0–15.0)
Immature Granulocytes: 1 %
Lymphocytes Relative: 30 %
Lymphs Abs: 1.7 10*3/uL (ref 0.7–4.0)
MCH: 29.7 pg (ref 26.0–34.0)
MCHC: 33 g/dL (ref 30.0–36.0)
MCV: 89.8 fL (ref 80.0–100.0)
Monocytes Absolute: 0.5 10*3/uL (ref 0.1–1.0)
Monocytes Relative: 9 %
Neutro Abs: 3.4 10*3/uL (ref 1.7–7.7)
Neutrophils Relative %: 57 %
Platelet Count: 278 10*3/uL (ref 150–400)
RBC: 4.62 MIL/uL (ref 3.87–5.11)
RDW: 13.2 % (ref 11.5–15.5)
WBC Count: 5.9 10*3/uL (ref 4.0–10.5)
nRBC: 0 % (ref 0.0–0.2)

## 2023-08-12 LAB — CMP (CANCER CENTER ONLY)
ALT: 46 U/L — ABNORMAL HIGH (ref 0–44)
AST: 28 U/L (ref 15–41)
Albumin: 4 g/dL (ref 3.5–5.0)
Alkaline Phosphatase: 121 U/L (ref 38–126)
Anion gap: 8 (ref 5–15)
BUN: 12 mg/dL (ref 6–20)
CO2: 30 mmol/L (ref 22–32)
Calcium: 9.6 mg/dL (ref 8.9–10.3)
Chloride: 105 mmol/L (ref 98–111)
Creatinine: 0.75 mg/dL (ref 0.44–1.00)
GFR, Estimated: >60 mL/min (ref >60)
Glucose, Bld: 104 mg/dL — ABNORMAL HIGH (ref 70–99)
Potassium: 4 mmol/L (ref 3.5–5.1)
Sodium: 143 mmol/L (ref 135–145)
Total Bilirubin: 0.6 mg/dL (ref <1.2)
Total Protein: 7.1 g/dL (ref 6.5–8.1)

## 2023-08-12 LAB — LACTATE DEHYDROGENASE: LDH: 148 U/L (ref 98–192)

## 2023-08-12 NOTE — Progress Notes (Signed)
Hematology and Oncology Follow Up Visit  Mercedes Beltran [id number] 1966-08-25 57 y.o. 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  We last saw her back in August.  Since then, she has been doing pretty well.  She has had really had no complaints.  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  This was back in November.  She is working quite a bit.  She really has not had too much time off.  She has had no issues with abdominal pain.  She has had no problems with nausea or vomiting.  There is been no change in bowel or bladder habits.  She has had no rashes.  There is been no bleeding.  Is been no leg swelling.  Overall, I would have said that her performance status is ECOG 0.    Medications:  Current Outpatient Medications:    AIRSUPRA 90-80 MCG/ACT AERO, Inhale 2 puffs into the lungs every 4 (four) hours as needed., Disp: , Rfl:    cetirizine (ZYRTEC) 10 MG tablet, Take 10 mg by mouth daily., Disp: , Rfl:    famotidine (PEPCID) 40 MG tablet, Take 40 mg by mouth daily., Disp: , Rfl:    fluticasone (FLONASE) 50 MCG/ACT nasal spray, Place 2 sprays into both nostrils daily., Disp: , Rfl:    OTEZLA 30 MG TABS, Take 30 mg by mouth 2 (two) times daily., Disp: , Rfl:    RABEprazole (ACIPHEX) 20 MG tablet, TAKE 2 CAPSULES BY MOUTH ONCE A DAY BEFORE MEALS, Disp: , Rfl:    tiZANidine (ZANAFLEX) 2 MG tablet, Take 1-2 tablets (2-4 mg total) by mouth every 6 (six) hours as needed for muscle spasms., Disp: 60 tablet, Rfl: 1   triamcinolone cream (KENALOG) 0.5 %, Apply topically at bedtime., Disp: , Rfl:    VAGIFEM 10 MCG TABS vaginal tablet, SMARTSIG:1 Tablet(s) Vaginal Every Other Day, Disp: , Rfl:    nitroGLYCERIN (NITROSTAT) 0.4 MG SL tablet, Place 0.4 mg under the tongue  every 5 (five) minutes as needed for chest pain. (Patient not taking: Reported on 08/12/2023), Disp: , Rfl:    ondansetron (ZOFRAN) 4 MG tablet, Take 1 tablet (4 mg total) by mouth every 8 (eight) hours as needed for nausea or vomiting. (Patient not taking: Reported on 08/12/2023), Disp: 30 tablet, Rfl: 1  Allergies:  Allergies  Allergen Reactions   Demerol  [Meperidine Hcl] Nausea And Vomiting   Codeine Nausea And Vomiting   Demerol [Meperidine] Nausea And Vomiting   Morphine And Codeine Nausea And Vomiting    Past Medical History, Surgical history, Social history, and Family History were reviewed and updated.  Review of Systems: Review of Systems  Constitutional:  Positive for fatigue.  HENT:  Negative.    Eyes: Negative.   Respiratory:  Positive for shortness of breath.   Cardiovascular:  Positive for chest pain.  Gastrointestinal: Negative.   Endocrine: Negative.   Genitourinary: Negative.    Musculoskeletal:  Positive for arthralgias.  Skin: Negative.   Neurological: Negative.   Hematological: Negative.   Psychiatric/Behavioral: Negative.      Physical Exam: Vital signs are temperature of 97.6.  Pulse 59.  Blood pressure 122/66.  Weight is 163 pounds.    Wt Readings from Last 3 Encounters:  08/12/23  163 lb 6.4 oz (74.1 kg)  04/22/23 161 lb (73 kg)  04/08/23 160 lb (72.6 kg)    Physical Exam Vitals reviewed.  HENT:     Head: Normocephalic and atraumatic.  Eyes:     Pupils: Pupils are equal, round, and reactive to light.  Cardiovascular:     Rate and Rhythm: Normal rate and regular rhythm.     Heart sounds: Normal heart sounds.  Pulmonary:     Effort: Pulmonary effort is normal.     Breath sounds: Normal breath sounds.  Abdominal:     General: Bowel sounds are normal.     Palpations: Abdomen is soft.  Genitourinary:    Comments: Visual exam of the anus did show some small hemorrhoids.  There did not appear to be thrombosed. Musculoskeletal:        General:  No tenderness or deformity. Normal range of motion.     Cervical back: Normal range of motion.  Lymphadenopathy:     Cervical: No cervical adenopathy.  Skin:    General: Skin is warm and dry.     Findings: No erythema or rash.     Comments: On the middle upper portion of her back, there is a dark nodule.  This probably measures about 3 x 2 mm.  It is nontender.  Neurological:     Mental Status: She is alert and oriented to person, place, and time.  Psychiatric:        Behavior: Behavior normal.        Thought Content: Thought content normal.        Judgment: Judgment normal.      Lab Results  Component Value Date   WBC 5.9 08/12/2023   HGB 13.7 08/12/2023   HCT 41.5 08/12/2023   MCV 89.8 08/12/2023   PLT 278 08/12/2023     Chemistry      Component Value Date/Time   NA 143 08/12/2023 0800   NA 140 10/12/2022 1647   NA 143 08/23/2015 0824   K 4.0 08/12/2023 0800   K 4.3 08/23/2015 0824   CL 105 08/12/2023 0800   CL 105 08/23/2015 0824   CO2 30 08/12/2023 0800   CO2 25 08/23/2015 0824   BUN 12 08/12/2023 0800   BUN 10 10/12/2022 1647   BUN 11 08/23/2015 0824   CREATININE 0.75 08/12/2023 0800   CREATININE 0.8 08/23/2015 0824      Component Value Date/Time   CALCIUM 9.6 08/12/2023 0800   CALCIUM 9.4 08/23/2015 0824   ALKPHOS 121 08/12/2023 0800   ALKPHOS 61 08/23/2015 0824   AST 28 08/12/2023 0800   ALT 46 (H) 08/12/2023 0800   ALT 30 08/23/2015 0824   BILITOT 0.6 08/12/2023 0800       Impression and Plan: Mercedes Beltran is a very nice 57 year old white female.  She was found to have a localized lymphoma on upper endoscopy.    So far, everything is doing quite well.  I do not see any issues with respect to lymphoma.  The skin cancer really should not be a problem.  We will plan to get her back probably in about 6 months now.  I think we can try to move her appointments out a little bit more.    Josph Macho, MD 12/12/20248:57 AM

## 2023-08-13 LAB — BETA 2 MICROGLOBULIN, SERUM: Beta-2 Microglobulin: 1.5 mg/L (ref 0.6–2.4)

## 2023-08-30 ENCOUNTER — Encounter: Payer: Self-pay | Admitting: Hematology & Oncology

## 2023-09-06 ENCOUNTER — Ambulatory Visit (INDEPENDENT_AMBULATORY_CARE_PROVIDER_SITE_OTHER): Payer: BC Managed Care – PPO

## 2023-09-06 DIAGNOSIS — R55 Syncope and collapse: Secondary | ICD-10-CM

## 2023-09-08 LAB — CUP PACEART REMOTE DEVICE CHECK
Date Time Interrogation Session: 20250105230220
Implantable Pulse Generator Implant Date: 20210823

## 2023-09-15 ENCOUNTER — Telehealth: Payer: Self-pay | Admitting: *Deleted

## 2023-09-15 NOTE — Telephone Encounter (Signed)
 Received [employer] Claim and [employer]ness Claim forms from [employer] office for [employer] without [employer] for each of these third party release and no [employer] to assist in these claims.  Message left for Wendee Gatchell [phone] (home) to call this nurse to discuss and resolve.  Awaiting return call from [facility].

## 2023-09-16 NOTE — Telephone Encounter (Signed)
Connected with Kinslei Raether, [phone] (home) who would like [facility] emailed.  Reports she was "out of work 11-16-2022 through 01/17/2023.  Has worked without restrictions or limitations.  Would like a call for pick up to upload forms.  I have the biopsy pathology reports they need."   Confirmed e-mail address [email or web address].  No further questions or needs.

## 2023-10-11 ENCOUNTER — Ambulatory Visit (INDEPENDENT_AMBULATORY_CARE_PROVIDER_SITE_OTHER): Payer: BC Managed Care – PPO

## 2023-10-11 ENCOUNTER — Encounter: Payer: Self-pay | Admitting: Hematology & Oncology

## 2023-10-11 DIAGNOSIS — R55 Syncope and collapse: Secondary | ICD-10-CM | POA: Diagnosis not present

## 2023-10-11 LAB — CUP PACEART REMOTE DEVICE CHECK
Date Time Interrogation Session: 20250209230806
Implantable Pulse Generator Implant Date: 20210823

## 2023-10-18 ENCOUNTER — Encounter: Payer: Self-pay | Admitting: Internal Medicine

## 2023-10-19 NOTE — Progress Notes (Signed)
 [facility] Summary Report / Loop Recorder

## 2023-10-19 NOTE — Addendum Note (Signed)
Addended by: Geralyn Flash D on: 10/19/2023 11:05 AM   Modules accepted: Orders

## 2023-11-15 ENCOUNTER — Ambulatory Visit (INDEPENDENT_AMBULATORY_CARE_PROVIDER_SITE_OTHER): Payer: BC Managed Care – PPO

## 2023-11-15 DIAGNOSIS — R55 Syncope and collapse: Secondary | ICD-10-CM | POA: Diagnosis not present

## 2023-11-16 LAB — CUP PACEART REMOTE DEVICE CHECK
Date Time Interrogation Session: 20250316230242
Implantable Pulse Generator Implant Date: 20210823

## 2023-11-17 ENCOUNTER — Encounter: Payer: Self-pay | Admitting: Internal Medicine

## 2023-11-19 LAB — LAB REPORT - SCANNED
A1c: 5.7
EGFR: 88.9

## 2023-11-22 NOTE — Progress Notes (Signed)
 [facility] Summary Report / Loop Recorder

## 2023-12-20 ENCOUNTER — Ambulatory Visit (INDEPENDENT_AMBULATORY_CARE_PROVIDER_SITE_OTHER): Payer: BC Managed Care – PPO

## 2023-12-20 DIAGNOSIS — R55 Syncope and collapse: Secondary | ICD-10-CM | POA: Diagnosis not present

## 2023-12-20 LAB — CUP PACEART REMOTE DEVICE CHECK
Date Time Interrogation Session: 20250420230221
Implantable Pulse Generator Implant Date: 20210823

## 2023-12-21 ENCOUNTER — Encounter: Payer: Self-pay | Admitting: Internal Medicine

## 2024-01-06 NOTE — Addendum Note (Signed)
 Addended by: Edra Govern D on: 01/06/2024 11:40 AM   Modules accepted: Orders

## 2024-01-06 NOTE — Progress Notes (Signed)
 [facility] Summary Report / Loop Recorder

## 2024-01-20 ENCOUNTER — Ambulatory Visit (INDEPENDENT_AMBULATORY_CARE_PROVIDER_SITE_OTHER): Payer: Self-pay

## 2024-01-20 DIAGNOSIS — R55 Syncope and collapse: Secondary | ICD-10-CM | POA: Diagnosis not present

## 2024-01-20 LAB — CUP PACEART REMOTE DEVICE CHECK
Date Time Interrogation Session: 20250521230907
Implantable Pulse Generator Implant Date: 20210823

## 2024-01-23 ENCOUNTER — Ambulatory Visit: Payer: Self-pay | Admitting: Internal Medicine

## 2024-02-03 ENCOUNTER — Ambulatory Visit: Payer: BC Managed Care – PPO | Admitting: Medical Oncology

## 2024-02-03 ENCOUNTER — Inpatient Hospital Stay: Payer: Self-pay

## 2024-02-09 NOTE — Progress Notes (Signed)
 [facility] Summary Report / Loop Recorder

## 2024-02-17 ENCOUNTER — Inpatient Hospital Stay: Payer: Self-pay | Admitting: Medical Oncology

## 2024-02-17 ENCOUNTER — Inpatient Hospital Stay: Payer: Self-pay | Attending: Hematology & Oncology

## 2024-02-17 VITALS — BP 122/75 | HR 79 | Temp 97.6°F | Resp 17 | Ht 65.0 in | Wt 153.0 lb

## 2024-02-17 DIAGNOSIS — R0683 Snoring: Secondary | ICD-10-CM | POA: Diagnosis not present

## 2024-02-17 DIAGNOSIS — M2550 Pain in unspecified joint: Secondary | ICD-10-CM | POA: Diagnosis not present

## 2024-02-17 DIAGNOSIS — C8208 Follicular lymphoma grade I, lymph nodes of multiple sites: Secondary | ICD-10-CM | POA: Diagnosis not present

## 2024-02-17 DIAGNOSIS — K909 Intestinal malabsorption, unspecified: Secondary | ICD-10-CM | POA: Insufficient documentation

## 2024-02-17 DIAGNOSIS — R634 Abnormal weight loss: Secondary | ICD-10-CM | POA: Insufficient documentation

## 2024-02-17 DIAGNOSIS — Z885 Allergy status to narcotic agent status: Secondary | ICD-10-CM | POA: Insufficient documentation

## 2024-02-17 DIAGNOSIS — C8293 Follicular lymphoma, unspecified, intra-abdominal lymph nodes: Secondary | ICD-10-CM | POA: Diagnosis present

## 2024-02-17 DIAGNOSIS — R935 Abnormal findings on diagnostic imaging of other abdominal regions, including retroperitoneum: Secondary | ICD-10-CM

## 2024-02-17 DIAGNOSIS — Z79899 Other long term (current) drug therapy: Secondary | ICD-10-CM | POA: Insufficient documentation

## 2024-02-17 DIAGNOSIS — R930 Abnormal findings on diagnostic imaging of skull and head, not elsewhere classified: Secondary | ICD-10-CM | POA: Insufficient documentation

## 2024-02-17 LAB — CMP (CANCER CENTER ONLY)
ALT: 35 U/L (ref 0–44)
AST: 33 U/L (ref 15–41)
Albumin: 4.2 g/dL (ref 3.5–5.0)
Alkaline Phosphatase: 121 U/L (ref 38–126)
Anion gap: 8 (ref 5–15)
BUN: 10 mg/dL (ref 6–20)
CO2: 31 mmol/L (ref 22–32)
Calcium: 9.9 mg/dL (ref 8.9–10.3)
Chloride: 102 mmol/L (ref 98–111)
Creatinine: 0.93 mg/dL (ref 0.44–1.00)
GFR, Estimated: >60 mL/min (ref >60)
Glucose, Bld: 129 mg/dL — ABNORMAL HIGH (ref 70–99)
Potassium: 4.5 mmol/L (ref 3.5–5.1)
Sodium: 141 mmol/L (ref 135–145)
Total Bilirubin: 0.7 mg/dL (ref 0.0–1.2)
Total Protein: 7.7 g/dL (ref 6.5–8.1)

## 2024-02-17 LAB — CBC WITH DIFFERENTIAL (CANCER CENTER ONLY)
Abs Immature Granulocytes: 0.03 10*3/uL (ref 0.00–0.07)
Basophils Absolute: 0.1 10*3/uL (ref 0.0–0.1)
Basophils Relative: 1 %
Eosinophils Absolute: 0.1 10*3/uL (ref 0.0–0.5)
Eosinophils Relative: 2 %
HCT: 46.6 % — ABNORMAL HIGH (ref 36.0–46.0)
Hemoglobin: 15.3 g/dL — ABNORMAL HIGH (ref 12.0–15.0)
Immature Granulocytes: 0 %
Lymphocytes Relative: 26 %
Lymphs Abs: 1.8 10*3/uL (ref 0.7–4.0)
MCH: 28.8 pg (ref 26.0–34.0)
MCHC: 32.8 g/dL (ref 30.0–36.0)
MCV: 87.8 fL (ref 80.0–100.0)
Monocytes Absolute: 0.8 10*3/uL (ref 0.1–1.0)
Monocytes Relative: 11 %
Neutro Abs: 4.1 10*3/uL (ref 1.7–7.7)
Neutrophils Relative %: 60 %
Platelet Count: 328 10*3/uL (ref 150–400)
RBC: 5.31 MIL/uL — ABNORMAL HIGH (ref 3.87–5.11)
RDW: 13.3 % (ref 11.5–15.5)
Smear Review: NORMAL
WBC Count: 6.8 10*3/uL (ref 4.0–10.5)
nRBC: 0 % (ref 0.0–0.2)

## 2024-02-17 LAB — LACTATE DEHYDROGENASE: LDH: 216 U/L — ABNORMAL HIGH (ref 98–192)

## 2024-02-17 NOTE — Progress Notes (Signed)
 Hematology and Oncology Follow Up Visit  Mercedes Beltran [id number] 1966/02/15 58 y.o. 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  She reports that she has been doing well. She has no concerns.   She has had no issues with abdominal pain.  She has had no problems with nausea or vomiting.  There is been no change in bowel or bladder habits.  No night sweats, fevers, illness, infections.   She has had no rashes.  There is been no bleeding.  Is been no leg swelling.  She does have a history of intermittent elevated Hgb levels. She is a non-smoker. No history of sleep apnea. She does report snoring. She is agreeable to discuss this with her PCP as she likely would benefit from a sleep study.   Overall, I would have said that her performance status is ECOG 0.    She has lost some weight unintentionally but she reports that her weight fluctuates normally  Wt Readings from Last 3 Encounters:  02/17/24 153 lb (69.4 kg)  08/12/23 163 lb 6.4 oz (74.1 kg)  04/22/23 161 lb (73 kg)     Medications:  Current Outpatient Medications:    AIRSUPRA 90-80 MCG/ACT AERO, Inhale 2 puffs into the lungs every 4 (four) hours as needed., Disp: , Rfl:    cetirizine (ZYRTEC) 10 MG tablet, Take 10 mg by mouth daily., Disp: , Rfl:    famotidine (PEPCID) 40 MG tablet, Take 40 mg by mouth daily., Disp: , Rfl:    nitroGLYCERIN  (NITROSTAT ) 0.4 MG SL tablet, Place 0.4 mg under the tongue every 5 (five) minutes as needed for chest pain., Disp: , Rfl:    ondansetron  (ZOFRAN ) 4 MG tablet, Take 1 tablet (4 mg total) by mouth every 8 (eight) hours as needed for nausea or vomiting., Disp: 30 tablet, Rfl: 1   OTEZLA 30 MG TABS, Take 30 mg by mouth 2 (two) times daily., Disp: , Rfl:    RABEprazole (ACIPHEX) 20 MG tablet, TAKE 2  CAPSULES BY MOUTH ONCE A DAY BEFORE MEALS, Disp: , Rfl:    tiZANidine  (ZANAFLEX ) 2 MG tablet, Take 1-2 tablets (2-4 mg total) by mouth every 6 (six) hours as needed for muscle spasms., Disp: 60 tablet, Rfl: 1   triamcinolone  cream (KENALOG ) 0.5 %, Apply topically at bedtime., Disp: , Rfl:    VAGIFEM 10 MCG TABS vaginal tablet, SMARTSIG:1 Tablet(s) Vaginal Every Other Day, Disp: , Rfl:    fluticasone (FLONASE) 50 MCG/ACT nasal spray, Place 2 sprays into both nostrils daily., Disp: , Rfl:   Allergies:  Allergies  Allergen Reactions   Demerol  [Meperidine Hcl] Nausea And Vomiting   Codeine Nausea And Vomiting   Demerol [Meperidine] Nausea And Vomiting   Morphine And Codeine Nausea And Vomiting    Past Medical History, Surgical history, Social history, and Family History were reviewed and updated.  Review of Systems: Review of Systems  Constitutional:  Negative for fatigue.  HENT:  Negative.    Eyes: Negative.   Respiratory:  Negative for shortness of breath.   Cardiovascular:  Negative for chest pain.  Gastrointestinal: Negative.   Endocrine: Negative.   Genitourinary: Negative.    Musculoskeletal:  Positive for arthralgias.  Skin: Negative.   Neurological: Negative.   Hematological: Negative.   Psychiatric/Behavioral: Negative.  Physical Exam: Vitals:   02/17/24 0903  BP: 122/75  Pulse: 79  Resp: 17  Temp: 97.6 F (36.4 C)  SpO2: 99%     Wt Readings from Last 3 Encounters:  02/17/24 153 lb (69.4 kg)  08/12/23 163 lb 6.4 oz (74.1 kg)  04/22/23 161 lb (73 kg)    Physical Exam Vitals reviewed.  HENT:     Head: Normocephalic and atraumatic.   Eyes:     Pupils: Pupils are equal, round, and reactive to light.    Cardiovascular:     Rate and Rhythm: Normal rate and regular rhythm.     Heart sounds: Normal heart sounds.  Pulmonary:     Effort: Pulmonary effort is normal.     Breath sounds: Normal breath sounds.  Abdominal:     General: Bowel sounds are  normal.     Palpations: Abdomen is soft.   Musculoskeletal:        General: No tenderness or deformity. Normal range of motion.     Cervical back: Normal range of motion.  Lymphadenopathy:     Cervical: No cervical adenopathy.   Skin:    General: Skin is warm and dry.     Findings: No erythema or rash.   Neurological:     Mental Status: She is alert and oriented to person, place, and time.   Psychiatric:        Behavior: Behavior normal.        Thought Content: Thought content normal.        Judgment: Judgment normal.      Lab Results  Component Value Date   WBC 6.8 02/17/2024   HGB 15.3 (H) 02/17/2024   HCT 46.6 (H) 02/17/2024   MCV 87.8 02/17/2024   PLT 328 02/17/2024     Chemistry      Component Value Date/Time   NA 143 08/12/2023 0800   NA 140 10/12/2022 1647   NA 143 08/23/2015 0824   K 4.0 08/12/2023 0800   K 4.3 08/23/2015 0824   CL 105 08/12/2023 0800   CL 105 08/23/2015 0824   CO2 30 08/12/2023 0800   CO2 25 08/23/2015 0824   BUN 12 08/12/2023 0800   BUN 10 10/12/2022 1647   BUN 11 08/23/2015 0824   CREATININE 0.75 08/12/2023 0800   CREATININE 0.8 08/23/2015 0824      Component Value Date/Time   CALCIUM 9.6 08/12/2023 0800   CALCIUM 9.4 08/23/2015 0824   ALKPHOS 121 08/12/2023 0800   ALKPHOS 61 08/23/2015 0824   AST 28 08/12/2023 0800   ALT 46 (H) 08/12/2023 0800   ALT 30 08/23/2015 0824   BILITOT 0.6 08/12/2023 0800     Encounter Diagnoses  Name Primary?   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  She was found to have a localized lymphoma on upper endoscopy.    So far, everything is doing quite well- we will need to keep an eye on her weight though.   Last PET was on 04/08/2023. Repeat in 6-12 months recommended given a simple appearing left ovarian cyst. Given unintentional weight  loss I would recommend a repeat PET scan.   Her Hg is elevated again today. She has had negative JAK2 testing in 2024. Likely secondary. She will discuss a sleep study with her PCP.  PET in August RTC 6 months MD, labs (CBC w/, CMP, LDH, retic, iron, ferritin)   Sharla Davis, PA-C 6/19/20259:24 AM

## 2024-02-21 ENCOUNTER — Ambulatory Visit (INDEPENDENT_AMBULATORY_CARE_PROVIDER_SITE_OTHER): Payer: Self-pay

## 2024-02-21 DIAGNOSIS — R55 Syncope and collapse: Secondary | ICD-10-CM | POA: Diagnosis not present

## 2024-02-22 LAB — CUP PACEART REMOTE DEVICE CHECK
Date Time Interrogation Session: 20250622231013
Implantable Pulse Generator Implant Date: 20210823

## 2024-02-27 ENCOUNTER — Ambulatory Visit: Payer: Self-pay | Admitting: Internal Medicine

## 2024-03-08 NOTE — Progress Notes (Signed)
 [facility] Summary Report / Loop Recorder

## 2024-03-17 ENCOUNTER — Encounter (HOSPITAL_COMMUNITY)
Admission: RE | Admit: 2024-03-17 | Discharge: 2024-03-17 | Disposition: A | Discharge status: Home / Self Care | Source: Ambulatory Visit | Attending: Medical Oncology | Admitting: Medical Oncology

## 2024-03-17 DIAGNOSIS — C8208 Follicular lymphoma grade I, lymph nodes of multiple sites: Secondary | ICD-10-CM | POA: Diagnosis present

## 2024-03-17 DIAGNOSIS — R935 Abnormal findings on diagnostic imaging of other abdominal regions, including retroperitoneum: Secondary | ICD-10-CM | POA: Diagnosis present

## 2024-03-17 DIAGNOSIS — R634 Abnormal weight loss: Secondary | ICD-10-CM | POA: Diagnosis present

## 2024-03-17 LAB — GLUCOSE, CAPILLARY: Glucose-Capillary: 101 mg/dL — ABNORMAL HIGH (ref 70–99)

## 2024-03-17 MED ORDER — FLUDEOXYGLUCOSE F - 18 (FDG) INJECTION
7.2500 | Freq: Once | INTRAVENOUS | Status: AC | PRN
  Administered 2024-03-17: 7.25 via INTRAVENOUS

## 2024-03-23 ENCOUNTER — Ambulatory Visit (INDEPENDENT_AMBULATORY_CARE_PROVIDER_SITE_OTHER): Payer: Self-pay

## 2024-03-23 DIAGNOSIS — R55 Syncope and collapse: Secondary | ICD-10-CM

## 2024-03-23 LAB — CUP PACEART REMOTE DEVICE CHECK
Date Time Interrogation Session: 20250723230324
Implantable Pulse Generator Implant Date: 20210823

## 2024-03-26 ENCOUNTER — Ambulatory Visit: Payer: Self-pay | Admitting: Internal Medicine

## 2024-03-29 NOTE — Addendum Note (Signed)
 Addended by: TAWNI DRILLING D on: 03/29/2024 01:24 PM   Modules accepted: Orders

## 2024-03-29 NOTE — Progress Notes (Signed)
 [facility] Summary Report / Loop Recorder

## 2024-04-24 ENCOUNTER — Ambulatory Visit (INDEPENDENT_AMBULATORY_CARE_PROVIDER_SITE_OTHER): Payer: Self-pay

## 2024-04-24 DIAGNOSIS — R55 Syncope and collapse: Secondary | ICD-10-CM

## 2024-04-25 LAB — CUP PACEART REMOTE DEVICE CHECK
Date Time Interrogation Session: 20250823230428
Implantable Pulse Generator Implant Date: 20210823

## 2024-04-26 ENCOUNTER — Ambulatory Visit: Payer: Self-pay | Admitting: Internal Medicine

## 2024-04-26 NOTE — Progress Notes (Signed)
 Remote Loop Recorder Transmission

## 2024-05-25 ENCOUNTER — Ambulatory Visit (INDEPENDENT_AMBULATORY_CARE_PROVIDER_SITE_OTHER): Payer: Self-pay

## 2024-05-25 DIAGNOSIS — I471 Supraventricular tachycardia, unspecified: Secondary | ICD-10-CM

## 2024-05-25 LAB — CUP PACEART REMOTE DEVICE CHECK
Date Time Interrogation Session: 20250924230210
Implantable Pulse Generator Implant Date: 20210823

## 2024-05-28 ENCOUNTER — Ambulatory Visit: Payer: Self-pay | Admitting: Internal Medicine

## 2024-05-29 NOTE — Progress Notes (Signed)
 Remote Loop Recorder Transmission

## 2024-06-01 NOTE — Progress Notes (Signed)
 Remote Loop Recorder Transmission

## 2024-06-26 ENCOUNTER — Ambulatory Visit (INDEPENDENT_AMBULATORY_CARE_PROVIDER_SITE_OTHER): Payer: Self-pay

## 2024-06-26 DIAGNOSIS — I471 Supraventricular tachycardia, unspecified: Secondary | ICD-10-CM | POA: Diagnosis not present

## 2024-06-26 LAB — CUP PACEART REMOTE DEVICE CHECK
Date Time Interrogation Session: 20251026230539
Implantable Pulse Generator Implant Date: 20210823

## 2024-06-29 ENCOUNTER — Ambulatory Visit: Payer: Self-pay | Admitting: Internal Medicine

## 2024-06-29 NOTE — Progress Notes (Signed)
 Remote Loop Recorder Transmission

## 2024-07-29 ENCOUNTER — Ambulatory Visit: Attending: Internal Medicine

## 2024-07-29 DIAGNOSIS — I471 Supraventricular tachycardia, unspecified: Secondary | ICD-10-CM | POA: Diagnosis not present

## 2024-07-31 LAB — CUP PACEART REMOTE DEVICE CHECK
Date Time Interrogation Session: 20251128230140
Implantable Pulse Generator Implant Date: 20210823

## 2024-08-02 NOTE — Progress Notes (Signed)
 Remote Loop Recorder Transmission

## 2024-08-03 ENCOUNTER — Ambulatory Visit: Payer: Self-pay | Admitting: Internal Medicine

## 2024-08-22 ENCOUNTER — Ambulatory Visit: Admitting: Medical Oncology

## 2024-08-22 ENCOUNTER — Other Ambulatory Visit

## 2024-08-23 ENCOUNTER — Inpatient Hospital Stay: Attending: Hematology & Oncology

## 2024-08-23 ENCOUNTER — Inpatient Hospital Stay: Admitting: Medical Oncology

## 2024-08-23 ENCOUNTER — Encounter: Payer: Self-pay | Admitting: Medical Oncology

## 2024-08-23 ENCOUNTER — Other Ambulatory Visit: Payer: Self-pay

## 2024-08-23 VITALS — BP 118/75 | HR 55 | Temp 98.0°F | Resp 18 | Ht 65.0 in | Wt 153.0 lb

## 2024-08-23 DIAGNOSIS — Z79818 Long term (current) use of other agents affecting estrogen receptors and estrogen levels: Secondary | ICD-10-CM | POA: Insufficient documentation

## 2024-08-23 DIAGNOSIS — C8208 Follicular lymphoma grade I, lymph nodes of multiple sites: Secondary | ICD-10-CM | POA: Diagnosis not present

## 2024-08-23 DIAGNOSIS — N390 Urinary tract infection, site not specified: Secondary | ICD-10-CM | POA: Insufficient documentation

## 2024-08-23 DIAGNOSIS — M2550 Pain in unspecified joint: Secondary | ICD-10-CM | POA: Diagnosis not present

## 2024-08-23 DIAGNOSIS — Z87440 Personal history of urinary (tract) infections: Secondary | ICD-10-CM | POA: Diagnosis not present

## 2024-08-23 DIAGNOSIS — R634 Abnormal weight loss: Secondary | ICD-10-CM | POA: Diagnosis not present

## 2024-08-23 DIAGNOSIS — R110 Nausea: Secondary | ICD-10-CM | POA: Diagnosis not present

## 2024-08-23 DIAGNOSIS — Z79899 Other long term (current) drug therapy: Secondary | ICD-10-CM | POA: Diagnosis not present

## 2024-08-23 DIAGNOSIS — Z885 Allergy status to narcotic agent status: Secondary | ICD-10-CM | POA: Diagnosis not present

## 2024-08-23 DIAGNOSIS — C8203 Follicular lymphoma grade I, intra-abdominal lymph nodes: Secondary | ICD-10-CM | POA: Diagnosis present

## 2024-08-23 DIAGNOSIS — R3589 Other polyuria: Secondary | ICD-10-CM | POA: Insufficient documentation

## 2024-08-23 DIAGNOSIS — K909 Intestinal malabsorption, unspecified: Secondary | ICD-10-CM | POA: Insufficient documentation

## 2024-08-23 LAB — CBC WITH DIFFERENTIAL (CANCER CENTER ONLY)
Abs Immature Granulocytes: 0.01 K/uL (ref 0.00–0.07)
Basophils Absolute: 0.1 K/uL (ref 0.0–0.1)
Basophils Relative: 1 %
Eosinophils Absolute: 0.1 K/uL (ref 0.0–0.5)
Eosinophils Relative: 2 %
HCT: 41.7 % (ref 36.0–46.0)
Hemoglobin: 13.8 g/dL (ref 12.0–15.0)
Immature Granulocytes: 0 %
Lymphocytes Relative: 40 %
Lymphs Abs: 2.9 K/uL (ref 0.7–4.0)
MCH: 28.9 pg (ref 26.0–34.0)
MCHC: 33.1 g/dL (ref 30.0–36.0)
MCV: 87.2 fL (ref 80.0–100.0)
Monocytes Absolute: 0.5 K/uL (ref 0.1–1.0)
Monocytes Relative: 7 %
Neutro Abs: 3.5 K/uL (ref 1.7–7.7)
Neutrophils Relative %: 50 %
Platelet Count: 279 K/uL (ref 150–400)
RBC: 4.78 MIL/uL (ref 3.87–5.11)
RDW: 13.1 % (ref 11.5–15.5)
WBC Count: 7.2 K/uL (ref 4.0–10.5)
nRBC: 0 % (ref 0.0–0.2)

## 2024-08-23 LAB — CMP (CANCER CENTER ONLY)
ALT: 51 U/L — ABNORMAL HIGH (ref 0–44)
AST: 32 U/L (ref 15–41)
Albumin: 4.1 g/dL (ref 3.5–5.0)
Alkaline Phosphatase: 138 U/L — ABNORMAL HIGH (ref 38–126)
Anion gap: 10 (ref 5–15)
BUN: 9 mg/dL (ref 6–20)
CO2: 28 mmol/L (ref 22–32)
Calcium: 9.7 mg/dL (ref 8.9–10.3)
Chloride: 105 mmol/L (ref 98–111)
Creatinine: 0.78 mg/dL (ref 0.44–1.00)
GFR, Estimated: >60 mL/min
Glucose, Bld: 99 mg/dL (ref 70–99)
Potassium: 4.3 mmol/L (ref 3.5–5.1)
Sodium: 142 mmol/L (ref 135–145)
Total Bilirubin: 0.6 mg/dL (ref 0.0–1.2)
Total Protein: 6.9 g/dL (ref 6.5–8.1)

## 2024-08-23 LAB — IRON AND IRON BINDING CAPACITY (CC-WL,HP ONLY)
Iron: 118 ug/dL (ref 28–170)
Saturation Ratios: 32 % — ABNORMAL HIGH (ref 10.4–31.8)
TIBC: 372 ug/dL (ref 250–450)
UIBC: 254 ug/dL

## 2024-08-23 LAB — RETIC PANEL
Immature Retic Fract: 12.2 % (ref 2.3–15.9)
RBC.: 4.69 MIL/uL (ref 3.87–5.11)
Retic Count, Absolute: 49.7 K/uL (ref 19.0–186.0)
Retic Ct Pct: 1.1 % (ref 0.4–3.1)
Reticulocyte Hemoglobin: 34.2 pg

## 2024-08-23 LAB — FERRITIN: Ferritin: 34 ng/mL (ref 11–307)

## 2024-08-23 LAB — LACTATE DEHYDROGENASE: LDH: 145 U/L (ref 105–235)

## 2024-08-23 MED ORDER — SULFAMETHOXAZOLE-TRIMETHOPRIM 800-160 MG PO TABS: 1.0000 | ORAL_TABLET | Freq: Two times a day (BID) | ORAL | 0 refills | Status: AC

## 2024-08-23 NOTE — Progress Notes (Signed)
 " Hematology and Oncology Follow Up Visit  Mercedes Beltran [id number] [birth year] 58 y.o. 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  She is doing ok. She recently was treated for a UTI that grew E. Coli. She does not feel that it cleared. She has polyuria, urgency, nausea, cloudy urine. She denies fevers, abdominal pain. She was given Augmentin 500 mg TID but she reports that symptoms did not improve in all. This was the only ABX she has had over the past 3 months. No recent hospitalizations or procedures.   No night sweats, No other illness.   She has had no rashes.  There is been no bleeding.  Is been no leg swelling.  She does have a history of intermittent elevated Hgb levels. She is a non-smoker. No history of sleep apnea.   Overall, I would have said that her performance status is ECOG 0.    She has lost some weight unintentionally but she reports that her weight fluctuates normally  Wt Readings from Last 3 Encounters:  08/23/24 153 lb (69.4 kg)  02/17/24 153 lb (69.4 kg)  08/12/23 163 lb 6.4 oz (74.1 kg)     Medications:  Current Outpatient Medications:    sulfamethoxazole -trimethoprim  (BACTRIM  DS) 800-160 MG tablet, Take 1 tablet by mouth 2 (two) times daily., Disp: 14 tablet, Rfl: 0   AIRSUPRA 90-80 MCG/ACT AERO, Inhale 2 puffs into the lungs every 4 (four) hours as needed., Disp: , Rfl:    cetirizine (ZYRTEC) 10 MG tablet, Take 10 mg by mouth daily., Disp: , Rfl:    famotidine (PEPCID) 40 MG tablet, Take 40 mg by mouth daily., Disp: , Rfl:    fluticasone (FLONASE) 50 MCG/ACT nasal spray, Place 2 sprays into both nostrils daily., Disp: , Rfl:    nitroGLYCERIN  (NITROSTAT ) 0.4 MG SL tablet, Place 0.4 mg under the tongue every 5 (five) minutes as needed for chest pain., Disp: , Rfl:     ondansetron  (ZOFRAN ) 4 MG tablet, Take 1 tablet (4 mg total) by mouth every 8 (eight) hours as needed for nausea or vomiting., Disp: 30 tablet, Rfl: 1   OTEZLA 30 MG TABS, Take 30 mg by mouth 2 (two) times daily., Disp: , Rfl:    RABEprazole (ACIPHEX) 20 MG tablet, TAKE 2 CAPSULES BY MOUTH ONCE A DAY BEFORE MEALS, Disp: , Rfl:    tiZANidine  (ZANAFLEX ) 2 MG tablet, Take 1-2 tablets (2-4 mg total) by mouth every 6 (six) hours as needed for muscle spasms., Disp: 60 tablet, Rfl: 1   triamcinolone  cream (KENALOG ) 0.5 %, Apply topically at bedtime., Disp: , Rfl:    VAGIFEM 10 MCG TABS vaginal tablet, SMARTSIG:1 Tablet(s) Vaginal Every Other Day, Disp: , Rfl:   Allergies:  Allergies  Allergen Reactions   Demerol  [Meperidine Hcl] Nausea And Vomiting   Codeine Nausea And Vomiting   Demerol [Meperidine] Nausea And Vomiting   Morphine And Codeine Nausea And Vomiting    Past Medical History, Surgical history, Social history, and Family History were reviewed and updated.  Review of Systems: Review of Systems  Constitutional:  Negative for fatigue.  HENT:  Negative.    Eyes: Negative.   Respiratory:  Negative for shortness of breath.   Cardiovascular:  Negative for chest pain.  Gastrointestinal: Negative.   Endocrine:  Negative.   Genitourinary: Negative.    Musculoskeletal:  Positive for arthralgias.  Skin: Negative.   Neurological: Negative.   Hematological: Negative.   Psychiatric/Behavioral: Negative.      Physical Exam: Vitals:   08/23/24 0827  BP: 118/75  Pulse: (!) 55  Resp: 18  Temp: 98 F (36.7 C)  SpO2: 98%     Wt Readings from Last 3 Encounters:  08/23/24 153 lb (69.4 kg)  02/17/24 153 lb (69.4 kg)  08/12/23 163 lb 6.4 oz (74.1 kg)    Physical Exam Vitals reviewed.  HENT:     Head: Normocephalic and atraumatic.  Eyes:     Pupils: Pupils are equal, round, and reactive to light.  Cardiovascular:     Rate and Rhythm: Normal rate and regular rhythm.     Heart  sounds: Normal heart sounds.  Pulmonary:     Effort: Pulmonary effort is normal.     Breath sounds: Normal breath sounds.  Abdominal:     General: Bowel sounds are normal.     Palpations: Abdomen is soft.  Musculoskeletal:        General: No tenderness or deformity. Normal range of motion.     Cervical back: Normal range of motion.  Lymphadenopathy:     Cervical: No cervical adenopathy.  Skin:    General: Skin is warm and dry.     Findings: No erythema or rash.  Neurological:     Mental Status: She is alert and oriented to person, place, and time.  Psychiatric:        Behavior: Behavior normal.        Thought Content: Thought content normal.        Judgment: Judgment normal.      Lab Results  Component Value Date   WBC 7.2 08/23/2024   HGB 13.8 08/23/2024   HCT 41.7 08/23/2024   MCV 87.2 08/23/2024   PLT 279 08/23/2024     Chemistry      Component Value Date/Time   NA 142 08/23/2024 0809   NA 140 10/12/2022 1647   NA 143 08/23/2015 0824   K 4.3 08/23/2024 0809   K 4.3 08/23/2015 0824   CL 105 08/23/2024 0809   CL 105 08/23/2015 0824   CO2 28 08/23/2024 0809   CO2 25 08/23/2015 0824   BUN 9 08/23/2024 0809   BUN 10 10/12/2022 1647   BUN 11 08/23/2015 0824   CREATININE 0.78 08/23/2024 0809   CREATININE 0.8 08/23/2015 0824      Component Value Date/Time   CALCIUM 9.7 08/23/2024 0809   CALCIUM 9.4 08/23/2015 0824   ALKPHOS 138 (H) 08/23/2024 0809   ALKPHOS 61 08/23/2015 0824   AST 32 08/23/2024 0809   ALT 51 (H) 08/23/2024 0809   ALT 30 08/23/2015 0824   BILITOT 0.6 08/23/2024 0809     Encounter Diagnoses  Name Primary?   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  She was found to have a localized lymphoma on upper endoscopy.    Last PET scan was on 03/17/2024 showed Two small right ileocolic/right  paracolic nodes with maximum SUV of 3.5, [place] 3. These are nonspecific and could be reactive or Neoplastic. She also was found to have a left ovarian cyst with repeat imaging recommended in 6-12 months.   Her culture from  06/29/2024 grew out E. Coli that did not respond to her Augmentin TX. Will treat with Bactrim  DS BID x 7 days. I have suggested follow up with urology and we reviewed red flag signs and symptoms. Updated urine culture pending today.   Today CBC is normal.  CMP shows mile ALT and Alk Phos elevation but otherwise normal. She will discuss these values more with her PCP.  LDH has dropped from 216 to 145 Normal retic.  Iron pending.   PET in January ( at least 2 weeks after clearing UTI RTC 6 months MD, labs (CBC w/, CMP, LDH, retic, iron, ferritin)   Mercedes CHRISTELLA Dais, PA-C 12/24/20259:38 AM "

## 2024-08-25 LAB — URINE CULTURE: Culture: >=100000 — AB

## 2024-08-29 ENCOUNTER — Ambulatory Visit: Attending: Family

## 2024-09-19 ENCOUNTER — Encounter: Payer: Self-pay | Admitting: Hematology & Oncology

## 2024-09-28 ENCOUNTER — Telehealth: Payer: Self-pay

## 2024-09-28 NOTE — Telephone Encounter (Signed)
 A peer to peer was scheduled for 0830 this am to attempt to authorize the patient PET scan scheduled for 09/29/2024. No phone call was received. This RN attempted to have peer to peer completed today as to avoid a cancelled PET scan appointment. Insurance company unable to accommodate a peer to peer today and it is scheduled for 10/02/2024 at 1145.  This RN called patient to inform her of why her PET scan was cancelled. Pt verbalized understanding. Pt aware of plan and date for new peer to peer. Pt understanding but frustrated with situation. Apologies offered. Pt had no further needs identified.

## 2024-09-29 ENCOUNTER — Ambulatory Visit: Attending: Family

## 2024-09-29 ENCOUNTER — Encounter (HOSPITAL_COMMUNITY): Admission: RE | Admit: 2024-09-29 | Source: Ambulatory Visit

## 2024-10-04 ENCOUNTER — Telehealth: Payer: Self-pay | Admitting: Cardiology

## 2024-10-04 NOTE — Telephone Encounter (Signed)
 Called re missed remote

## 2024-10-27 ENCOUNTER — Encounter (HOSPITAL_COMMUNITY)

## 2024-10-30 ENCOUNTER — Ambulatory Visit

## 2024-11-30 ENCOUNTER — Ambulatory Visit

## 2024-12-31 ENCOUNTER — Ambulatory Visit

## 2025-01-31 ENCOUNTER — Ambulatory Visit

## 2025-02-21 ENCOUNTER — Inpatient Hospital Stay

## 2025-02-21 ENCOUNTER — Inpatient Hospital Stay: Admitting: Hematology & Oncology

## 2025-03-03 ENCOUNTER — Ambulatory Visit

## 2025-04-03 ENCOUNTER — Ambulatory Visit

## 2025-05-04 ENCOUNTER — Ambulatory Visit

## 2025-06-04 ENCOUNTER — Ambulatory Visit

## 2025-07-05 ENCOUNTER — Ambulatory Visit

## 2025-08-05 ENCOUNTER — Ambulatory Visit

## 2025-09-05 ENCOUNTER — Ambulatory Visit
